# Patient Record
Sex: Male | Born: 1985 | Race: White | Hispanic: No | Marital: Single | Smoking: Never smoker
Health system: Southern US, Community
[De-identification: ages and names within clinical notes are randomized; demographics above are authoritative.]

## PROBLEM LIST (undated history)

## (undated) DIAGNOSIS — R569 Unspecified convulsions: Secondary | ICD-10-CM

## (undated) DIAGNOSIS — F79 Unspecified intellectual disabilities: Secondary | ICD-10-CM

## (undated) HISTORY — PX: HERNIA REPAIR: SHX51

---
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
## 2006-07-17 ENCOUNTER — Ambulatory Visit: Payer: Self-pay | Admitting: Pediatrics

## 2006-08-21 ENCOUNTER — Ambulatory Visit (HOSPITAL_COMMUNITY): Admission: RE | Admit: 2006-08-21 | Discharge: 2006-08-21 | Payer: Self-pay | Admitting: Pediatrics

## 2006-10-05 ENCOUNTER — Emergency Department (HOSPITAL_COMMUNITY): Admission: EM | Admit: 2006-10-05 | Discharge: 2006-10-05 | Payer: Self-pay | Admitting: Family Medicine

## 2006-12-29 ENCOUNTER — Ambulatory Visit (HOSPITAL_COMMUNITY): Admission: RE | Admit: 2006-12-29 | Discharge: 2006-12-29 | Payer: Self-pay | Admitting: *Deleted

## 2007-01-12 ENCOUNTER — Ambulatory Visit: Payer: Self-pay | Admitting: Pediatrics

## 2008-01-11 ENCOUNTER — Ambulatory Visit: Payer: Self-pay | Admitting: Pediatrics

## 2008-01-24 ENCOUNTER — Emergency Department (HOSPITAL_COMMUNITY): Admission: EM | Admit: 2008-01-24 | Discharge: 2008-01-24 | Payer: Self-pay | Admitting: Emergency Medicine

## 2008-05-22 ENCOUNTER — Emergency Department (HOSPITAL_COMMUNITY): Admission: EM | Admit: 2008-05-22 | Discharge: 2008-05-22 | Payer: Self-pay | Admitting: Emergency Medicine

## 2008-06-05 ENCOUNTER — Observation Stay (HOSPITAL_COMMUNITY): Admission: AD | Admit: 2008-06-05 | Discharge: 2008-06-07 | Payer: Self-pay | Admitting: Internal Medicine

## 2008-11-08 ENCOUNTER — Ambulatory Visit: Payer: Self-pay | Admitting: Pediatrics

## 2009-04-21 ENCOUNTER — Ambulatory Visit: Payer: Self-pay | Admitting: Pediatrics

## 2009-05-11 ENCOUNTER — Ambulatory Visit: Payer: Self-pay | Admitting: Pediatrics

## 2009-08-08 ENCOUNTER — Ambulatory Visit: Payer: Self-pay | Admitting: Pediatrics

## 2009-12-13 ENCOUNTER — Ambulatory Visit: Payer: Self-pay | Admitting: Pediatrics

## 2010-04-09 ENCOUNTER — Ambulatory Visit (HOSPITAL_COMMUNITY): Payer: Self-pay | Admitting: Psychiatry

## 2010-04-18 ENCOUNTER — Ambulatory Visit (HOSPITAL_COMMUNITY): Payer: Self-pay | Admitting: Psychiatry

## 2010-05-02 ENCOUNTER — Ambulatory Visit (HOSPITAL_COMMUNITY): Payer: Self-pay | Admitting: Psychiatry

## 2010-05-23 ENCOUNTER — Ambulatory Visit (HOSPITAL_COMMUNITY): Payer: Self-pay | Admitting: Psychiatry

## 2010-06-06 ENCOUNTER — Ambulatory Visit (HOSPITAL_COMMUNITY): Payer: Self-pay | Admitting: Psychiatry

## 2010-06-27 ENCOUNTER — Ambulatory Visit (HOSPITAL_COMMUNITY): Payer: Self-pay | Admitting: Psychiatry

## 2010-08-15 ENCOUNTER — Ambulatory Visit (HOSPITAL_COMMUNITY): Payer: Self-pay | Admitting: Psychiatry

## 2010-10-03 ENCOUNTER — Ambulatory Visit (HOSPITAL_COMMUNITY): Payer: Self-pay | Admitting: Psychiatry

## 2010-11-14 ENCOUNTER — Ambulatory Visit (HOSPITAL_COMMUNITY): Admit: 2010-11-14 | Payer: Self-pay | Admitting: Psychiatry

## 2010-11-14 ENCOUNTER — Encounter (HOSPITAL_COMMUNITY): Payer: Self-pay | Admitting: Physician Assistant

## 2010-11-14 DIAGNOSIS — F39 Unspecified mood [affective] disorder: Secondary | ICD-10-CM

## 2010-12-26 ENCOUNTER — Encounter (HOSPITAL_COMMUNITY): Payer: 59 | Admitting: Physician Assistant

## 2010-12-26 DIAGNOSIS — F39 Unspecified mood [affective] disorder: Secondary | ICD-10-CM

## 2011-02-26 NOTE — Consult Note (Signed)
NAME:  Jay Krueger, Jay Krueger                 ACCOUNT NO.:  [id number]   MEDICAL RECORD NO.:  [id number]          PATIENT TYPE:  INP   LOCATION:  3040                         FACILITY:  [facility]   PHYSICIAN:  Levert Feinstein, MD          DATE OF BIRTH:  [birth year]   DATE OF CONSULTATION:  06/05/2008  DATE OF DISCHARGE:                                 CONSULTATION   CHIEF COMPLAINT:  Episodes of whole body tremor.   HISTORY OF PRESENT ILLNESS:  The patient is a 25 year old gentleman,  mentally retarded, and family is at the bedside for history, the history  is also from reviewing the previous admission note at [facility] at [place] on April 21, 2008.   In summary, this is a 25 year old male, born mentally retarded, the  patient's mother reported that he was 6 months premature, had difficult,  prolonged laboring. patient had a history of staring spells, when he was  31 months old, was under my partner Dr. Darl Householder care at that time, had  evaluation, including MRI, and EEG at that time, but was not sure, it  truly represented absent seizure or not per family, but later on, in his  teens, he did develop generalized convulsive activity, and family  described every time it has happened [place].  The last episode was about 4 years ago, again happened in the  beach, the patient had general tonic-clonic movement, urinary  incontinence, lasted about 5-10 minutes, followed by post event  confusion.  Over the years, he has tried different antiepileptic  medications, but father could not recall the name of the medication,  reported intolerable side effect, including eye blinking, or excessive  sedation.   He was apparently seen by my other partner, Dr. Nash Shearer in 2008, and  reported tried a different antiepileptic medication of unknown name,  again suffered side effect later stopped it.  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   Since April 2009, he has began to have different episodes, described as  bilateral upper extremity tremorish movement, intermixed with bilateral  hand, wrist and finger flexion, last a few seconds and then relax, but  multiple recurrent similar activity that can last up to hours.  In  addition, he also has frequent staring spells, can have couple of times  in a day, but that was at his baseline.  This admission was prompted by  frequent appearing's of bilateral upper extremity tremors, wrist  flexion, finger flexion, followed by extension, started on Friday,  lasted whole night on Saturday, the patient also became very sick,  nausea, vomiting, and cannot keep any food down.  Initially when it  first started in  April 2009, it  has happened about once a month, has  increased in frequency, last few episodes was associated with urinary  tract infection, improved by antibiotic treatment, but this particular  episode over the weekend was prolonged, the patient has vomiting  multiple times, that has prompted his ER visiting.   Family also reported that [facility] seems to be closer by to their home  town, so instead of bring the patient to [place] again, family has  brought him over here.  Now he is back to his baseline, which is  agitated, but happy majority of the time, nonverbal, and ambulating  without difficulty.   He was admitted to the [facility] in April 21, 2008, where he  underwent video EEG monitoring and there was one event described as his  right hand came up to his mouth, had some slight flexion, extension  movement.  He is still looking at his father during this time, and then  extended his right hand,  continued with his typical activity.  The whole  episode lasted about 1 minute.  EEG around at that time, there is  evidence of 3 Hz spike and waves activity, that is generalized, but  overall the EEG is similar to his baseline activity, not necessarily  correlates with the clinical event.   During that video EEG monitoring, he also had staring episode, he did  have 3 Hz spike and wave activity last about 3 seconds around to the  time of the event, but again this is very similar to his baseline EEG  activity, not necessarily related to his clinical event.   He was discharged home with Ativan sublingual p.r.n.  The patient's  family reported that they did try 3 times, but has not been helpful.  Family declined trial of antileptic medications now.   REVIEW OF SYSTEMS:  Nonobtainable.   PAST MEDICAL HISTORY:  Mental retardation and ADHD.   CURRENT MEDICATIONS:  Clonidine.   SURGICAL HISTORY:  Umbilical hernia repair.   FAMILY HISTORY:  Sister has myasthenia gravis.   SOCIAL HISTORY:  Denies smoke or drink.  Lives with his family.   PHYSICAL EXAMINATION:  VITAL SIGNS:  Temperature 97.0, blood pressure  170/84, heart rate of 87, and respiration of 20.  CARDIAC:  Regular rate and rhythm.  PULMONARY:  Clear to auscultation bilaterally.  NECK:  Supple.  No carotid bruits.  NEUROLOGICAL:  He is agitated, but quite happy, follow family commands,  nonverbal, but did make a noise.  He is pacing, ambulating on the floor  without difficulty, using his arms without any difficulty.  His face is  symmetric.  Eye movement was conjugate, pupil equal, round, and reactive  to light.  Deep tendon reflex was hypoactive and symmetric.  Plantar  responses were deferred.   ASSESSMENT AND PLAN:  Episodes, detailed above, in the setting of a 49-  year-old, mentally delayed, previous history of general tonic-clonic  seizure, documented abnormal EEG.   The episode could certainly represent a seizure,  however, with the  prolonged episode lasting  the whole night, it will have caused  significant damage to the brain and the patient himself, and in  addition, there was no loss of consciousness, the patient continued to  interact during the episode.  In that regard, it is less likely the  whole event represent a continued seizure activity.  The other  possibility would be a behavior problem.   I did have extensive discussion with the family, the best way to discern  it,  is to have video EEG monitoring again, we can do this at [facility], or they can go back to [facility] under Dr. Briscoe Deutscher  O'Donovan for repeat video EEG monitoring.   Less likely imaging study will yield any significant insight in his  case,  especially that he has gone back to his baseline level.   Family insists on having imaging study to rule out a structural lesion,  which is low on my differentiation list.   The family also refused to another trial of antiepileptic medications,  which I think is reasonable.   MRI of the brain with and without contrast already been ordered by  primary care team, the patient apparently would need sedation to go  through the MRI.      Levert Feinstein, MD  Electronically Signed     YY/MEDQ  D:  06/06/2008  T:  06/07/2008  Job:  [id number]

## 2011-02-26 NOTE — H&P (Signed)
NAME:  Jay Krueger, Jay                 ACCOUNT NO.:  [id number]   MEDICAL RECORD NO.:  [id number]          PATIENT TYPE:  INP   LOCATION:  3040                         FACILITY:  [facility]   PHYSICIAN:  Lonia Blood, M.D.DATE OF BIRTH:  12-19-1985   DATE OF ADMISSION:  06/05/2008  DATE OF DISCHARGE:                              HISTORY & PHYSICAL   PRIMARY CARE PHYSICIAN:  [facility].   CHIEF COMPLAINT:  Spasmodic episodes/questionable seizures/onset April  2009.   PRESENT ILLNESS:  [name] is a 25 year old gentleman with  questionable cerebral palsy.  His parents are with him and confirm that  he has been developmentally delayed since his birth.  A formal diagnosis  has not been made.  He resides with his parents and is usually a very  active though nonverbal individual.  In April of this year, he suffered  a single isolated episode of severe spasm/tightening of the lower  extremities.  With this, he loses the ability to walk and has fallen  over a number of times.  He is, however, redirectable during these  episodes.  He is nonverbal at baseline, but will turn his head and  respond and move his arms during these spells.  There have been  episodes, however, during which he lost control of his bladder function.  After the initial episode in April, the patient did not have another  episode for a number of months.  Since that time, however, these  episodes have been occurring with increasing frequency.  Initially, they  occurred once a month and then every few weeks, and over the last  weekend, they have occurred every single day without fail.  The episodes  last 2 to 3 minutes at most.  The family has recorded one of these  episodes on DEVELOPED, but due to the limitations of the computers in  the dictating area, I am not able to download the software that is  required to view the DVD.  The patient has been evaluated by the  neurology department at [facility]  over the last 2 months and no formal  diagnosis has been made at this point.  With acceleration of the  episodes to the point of occurring every day, the patient's mother and  father presented with him to the [facility] Emergency Room for  evaluation.  He was subsequently transferred to [facility] for inpatient  admission.   The patient is evaluated in bed of 3040 at [facility] after transfer on  direct admission.  He is awake and alert.  He is nonverbal.  He appears  quite happy and is interactive.  His family reports that he is at his  present baseline.  He is walking currently and is up around the room,  and has be redirected multiple times.   PAST MEDICAL HISTORY:  1. Mental retardation/questionable cerebral palsy.  2. Known arachnoid cyst in the anterior aspect of the mid cranial      fossa on the left impacting the temporal lobe via MRI March 2008      and confirmed  on CT scan April 2009.   OUTPATIENT MEDICATIONS.:  1. Clonidine 0.1 mg p.o. b.i.d. for behavioral issues.  2. Zofran 8 mg p.o. p.r.n.  3. The patient recently completed a course of Septra p.o. b.i.d.   ALLERGIES:  No known drug allergies.   FAMILY HISTORY:  Noncontributory.   SOCIAL HISTORY:  The patient does not smoke nor does he drink.  He lives  locally with his parents.   DATA REVIEW:  Hemoglobin is elevated at 17.3 with an MCV of 83.  White  count and platelet count are normal.  ABG is unremarkable.  Lactic acid  level is normal.  BMET is unremarkable.  Magnesium and phosphorus are  normal.  CT scan of the head from April 2009 reveals is limited due to  motion artifact but reveals no acute disease.  MRI in March of 2008  reveals the above-mentioned arachnoid cyst but is otherwise unrevealing.   PHYSICAL EXAM:  VITALS: Temperature 99, blood pressure 133/85, heart  rate 98, respiratory 20, O2 sats 96% room air.  GENERAL:  Well-developed, well-nourished mentally retarded gentleman in  no acute respiratory  distress.  HEENT:  Normocephalic, atraumatic.  Pupils equal round react to light  accommodation.  Extraocular muscles intact bilaterally.  OC essentially  clear.  NECK:  No JVD.  LUNGS:  Clear to auscultation bilaterally without wheezes or rhonchi.  CARDIOVASCULAR:  Regular rate and rhythm without murmur, gallop or rub.  Normal S1, S2.  ABDOMEN:  Nontender, nondistended, soft bowel sounds present.  No  hepatosplenomegaly, rebound or ascites.  EXTREMITIES:  There is no significant cyanosis, clubbing, edema  bilateral lower extremities.  NEUROLOGIC:  Exam is very difficult due to the patient's inability to  comply.  Cranial nerves II-XII are intact bilaterally, however and  pupils are equal and reactive and symmetric.  The patient does not  display any focal weakness and is ambulating about the room and is  actually quite strong.  There are no obvious cranial nerve deficits.  The patient is nonverbal but does communicate using hand gestures.  At  the present time he is extremely hungry and asks for something to drink  and eat.   IMPRESSION AND PLAN:  1. Sign-symptom complex - Many of the elements of these episodes sound      concerning for a seizure.  Nonetheless, the patient is alert and      interactive during these episodes, and this essentially rules out      the possibility of a seizure disorder.  Apparently, the patient has      had an extensive workup at [facility].      Not wishing to duplicate their efforts, I will hold on extensive      testing until we can obtain those records.  I am told this      evaluation included multiple EEGs.  The patient does have a family      member who has myasthenia gravis.  Though it is a long shot,      certainly the possibility of lower extremity weakness in the      patient is not able to otherwise communicate could result in some      of the symptoms he is experiencing.  I will, therefore, send off an       acetylcholine and antibody assay.  I will also check a prolactin      level to further support the idea that these are not seizures.  B12      and folic acid levels will be checked.  Consideration will be given      to sedated MRI for reevaluation of the patient's known arachnoid      cyst to rule out other intracranial abnormalities if records from      [facility] prove that this has been done.  2. Cerebral palsy/developmental delay - we will continue the patient's      clonidine.  We will follow the patient for behavioral issues during      his hospital stay.  Consideration could be given to psychiatric      evaluation if no physiologic basis is felt to be identifiable for      the above-listed sign-symptom complex.      Lonia Blood, M.D.  Electronically Signed     JTM/MEDQ  D:  06/05/2008  T:  06/05/2008  Job:  [id number]   cc:   [facility]

## 2011-03-01 NOTE — Procedures (Signed)
EEG NUMBER:  [id number].   HISTORY:  A 25 year old young man with cognitive delays with episodes of  staring, unresponsive episodes lasting 20-30 seconds.  Study is being done  to look for the presence of seizures 780.22.   PROCEDURE:  The tracing is carried out on a 32-channel digital Cadwell  recorder reformatted into 16 channel montages with one devoted to EKG.  The  patient was awake during the recording and active.  He takes clonidine.   DESCRIPTION OF FINDINGS:  Dominant frequency is a rhythmic 20 microvolt 6-7  Hz activity that is prominent broadly.  Superimposed upon this is mixed-  frequency with somewhat higher-voltage dysrhythmic theta and lower and upper  delta range activity.  The patient is awake throughout the record and does  not change state of arousal other than there are some times that he appears  to be somewhat more calm.  There was no focal slowing or interictal  epileptiform activity in the form of spikes or sharp waves.   IMPRESSION:  Abnormal EEG on the basis of mild diffuse background slowing.  This is a nonspecific indicator of neuronal dysfunction that may be either  on a primary degenerative basis or secondary to a variety of toxic or  metabolic etiologies.      Deanna Artis. Sharene Skeans, M.D.  Electronically Signed     NFA:OZHY  D:  08/22/2006 01:02:06  T:  08/22/2006 11:43:12  Job #:  [id number]   cc:   Candace Gallus. Ferd Glassing, M.D.  Fax: [phone]

## 2011-03-27 ENCOUNTER — Encounter (HOSPITAL_COMMUNITY): Payer: 59 | Admitting: Physician Assistant

## 2011-03-27 DIAGNOSIS — F39 Unspecified mood [affective] disorder: Secondary | ICD-10-CM

## 2011-03-27 DIAGNOSIS — F848 Other pervasive developmental disorders: Secondary | ICD-10-CM

## 2011-07-09 LAB — CBC
Hemoglobin: 16.1
MCHC: 34.3
RBC: 5.53
WBC: 9.3

## 2011-07-09 LAB — DIFFERENTIAL
Basophils Relative: 1
Lymphocytes Relative: 20
Monocytes Absolute: 0.5
Monocytes Relative: 5
Neutro Abs: 6.8
Neutrophils Relative %: 74

## 2011-07-09 LAB — POCT I-STAT, CHEM 8
BUN: 10
Calcium, Ion: 1.13
Chloride: 105
Glucose, Bld: 105 — ABNORMAL HIGH
HCT: 49
Potassium: 3.8

## 2011-07-12 LAB — CBC
HCT: 52.7 — ABNORMAL HIGH
MCHC: 33.8
MCV: 86.4
RBC: 6.1 — ABNORMAL HIGH
WBC: 10.7 — ABNORMAL HIGH

## 2011-07-12 LAB — URINALYSIS, ROUTINE W REFLEX MICROSCOPIC
Bilirubin Urine: NEGATIVE
Glucose, UA: NEGATIVE
Leukocytes, UA: NEGATIVE
Nitrite: NEGATIVE
Specific Gravity, Urine: 1.024
pH: 7

## 2011-07-12 LAB — DIFFERENTIAL
Basophils Absolute: 0
Eosinophils Relative: 0
Lymphocytes Relative: 20
Neutro Abs: 7.9 — ABNORMAL HIGH
Neutrophils Relative %: 74

## 2011-07-12 LAB — COMPREHENSIVE METABOLIC PANEL
BUN: 8
CO2: 27
Chloride: 105
Creatinine, Ser: 0.83
GFR calc non Af Amer: >60
Glucose, Bld: 111 — ABNORMAL HIGH
Total Bilirubin: 0.9

## 2011-07-12 LAB — URINE MICROSCOPIC-ADD ON

## 2011-07-31 ENCOUNTER — Encounter (HOSPITAL_COMMUNITY): Payer: 59 | Admitting: Physician Assistant

## 2011-07-31 DIAGNOSIS — F39 Unspecified mood [affective] disorder: Secondary | ICD-10-CM

## 2011-07-31 DIAGNOSIS — F848 Other pervasive developmental disorders: Secondary | ICD-10-CM

## 2012-01-29 ENCOUNTER — Ambulatory Visit (INDEPENDENT_AMBULATORY_CARE_PROVIDER_SITE_OTHER): Payer: 59 | Admitting: Physician Assistant

## 2012-01-29 DIAGNOSIS — F39 Unspecified mood [affective] disorder: Secondary | ICD-10-CM

## 2012-01-29 MED ORDER — CLONIDINE HCL 0.1 MG PO TABS: 0.1000 mg | ORAL_TABLET | Freq: Every day | ORAL | Status: DC

## 2012-01-29 MED ORDER — DIVALPROEX SODIUM ER 250 MG PO TB24: 250.0000 mg | ORAL_TABLET | Freq: Every day | ORAL | Status: DC

## 2012-01-29 MED ORDER — DIVALPROEX SODIUM ER 250 MG PO TB24: 250.0000 mg | ORAL_TABLET | Freq: Two times a day (BID) | ORAL | Status: DC

## 2012-02-03 NOTE — Progress Notes (Signed)
   [facility] Follow-up Outpatient Visit  Jay Krueger 1985-11-13  Date: 01/28/2021   Subjective: Jay presents today with his father and caretaker to follow up on medications prescribed for his mood disorder. Father reports that he is doing well for the most part. In [place] is occasional "issues." He states that at times Jay can be very stubborn and has an occasional angry outburst. He also reports that in the month of March he had 3 "episodes." These episodes are periods of seizure-like activity which have gone undiagnosed. He reports that Jay sleeps well from about 9 or 10 at night until 7 AM. His appetite is good and he is maintaining his weight.  There were no vitals filed for this visit.  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 He will followup in 4-6 months.  Dean Goldner, PA-C

## 2012-08-03 ENCOUNTER — Ambulatory Visit (INDEPENDENT_AMBULATORY_CARE_PROVIDER_SITE_OTHER): Payer: 59 | Admitting: Physician Assistant

## 2012-08-03 DIAGNOSIS — F848 Other pervasive developmental disorders: Secondary | ICD-10-CM

## 2012-08-03 DIAGNOSIS — F39 Unspecified mood [affective] disorder: Secondary | ICD-10-CM | POA: Insufficient documentation

## 2012-08-03 MED ORDER — CLONIDINE HCL 0.1 MG PO TABS: 0.1000 mg | ORAL_TABLET | Freq: Every day | ORAL | Status: DC

## 2012-08-03 MED ORDER — DIVALPROEX SODIUM ER 250 MG PO TB24: 250.0000 mg | ORAL_TABLET | Freq: Two times a day (BID) | ORAL | Status: DC

## 2012-08-03 NOTE — Progress Notes (Signed)
   [facility] Follow-up Outpatient Visit  [place] Berg[place] [birth year]  Date: 08/03/2012  Subjective: [place] presents today with his father and caretaker to followup on his treatment for his mood disorder and pervasive developmental delay. Father reports that, all in all, [place] has been stable. He continues to have an occasional temper tantrum, and "episode." He reports that the episodes are milder, but they last longer, sometimes up to 4 hours. After the episode he is active and cheerful. Sleep and appetite are good.  [place] has been to multiple various neurologists including some local, at [facility], and at [place]. None of these visits have provided any insight into Anton's disorder.  There were no vitals filed for this visit.  Mental Status Examination  Appearance: Casual Alert: Yes Attention: good  Cooperative: Yes Eye Contact: Good Speech: Unintelligible Psychomotor Activity: Restlessness Memory/Concentration: Undetermined Oriented: Undetermined Mood: Euthymic Affect: Congruent Thought Processes and Associations: Undetermined Fund of Knowledge: Poor Thought Content: Undetermined Insight: Poor Judgement: Fair  Diagnosis: Mood disorder NOS, pervasive developmental delay  Treatment Plan: We will continue his Depakote ER 250 mg twice daily, and clonidine 0.1 mg at bedtime. He will return for followup in 6 months. [place] may benefit from a school for children with developmental delay, if the family is interested.  Redell Bhandari, PA-C

## 2012-09-02 ENCOUNTER — Other Ambulatory Visit (HOSPITAL_COMMUNITY): Payer: Self-pay | Admitting: Physician Assistant

## 2012-09-02 MED ORDER — CLONIDINE HCL 0.1 MG PO TABS: 0.1000 mg | ORAL_TABLET | Freq: Every day | ORAL | Status: DC

## 2012-09-02 MED ORDER — DIVALPROEX SODIUM ER 250 MG PO TB24: 250.0000 mg | ORAL_TABLET | Freq: Two times a day (BID) | ORAL | Status: DC

## 2012-11-02 ENCOUNTER — Telehealth (HOSPITAL_COMMUNITY): Payer: Self-pay

## 2012-11-03 ENCOUNTER — Other Ambulatory Visit (HOSPITAL_COMMUNITY): Payer: Self-pay | Admitting: Physician Assistant

## 2013-02-01 ENCOUNTER — Ambulatory Visit (INDEPENDENT_AMBULATORY_CARE_PROVIDER_SITE_OTHER): Payer: 59 | Admitting: Physician Assistant

## 2013-02-01 ENCOUNTER — Encounter (HOSPITAL_COMMUNITY): Payer: Self-pay

## 2013-02-01 DIAGNOSIS — F848 Other pervasive developmental disorders: Secondary | ICD-10-CM

## 2013-02-01 DIAGNOSIS — F39 Unspecified mood [affective] disorder: Secondary | ICD-10-CM

## 2013-02-01 NOTE — Progress Notes (Signed)
   [facility] Follow-up Outpatient Visit  Jay Krueger August 22, 1986  Date: 02/01/2013   Subjective: Jay presents today with his father and 51, [name], to followup on his treatment for his mood disorder and developmental delay. They report that things are "about the same." It has been about 3 weeks since he had his last "episode." He was previously having them weekly. They report that they now last 5-6 hours. He is sleeping well and his appetite is good. There've been no behavioral problems. Father has not gotten him over to the epilepsy center for evaluation. He is sleeping well, and his appetite is good.  There were no vitals filed for this visit.  Mental Status Examination  Appearance: Casual Alert: Yes Attention: fair  Cooperative: Yes Eye Contact: Fair Speech: No speech, only laughter Psychomotor Activity: Increased Memory/Concentration: Undetermined Oriented: Undetermined Mood: Euthymic Affect: Appropriate Thought Processes and Associations: Undetermined Fund of Knowledge: Poor Thought Content: Undetermined Insight: Poor Judgement: Fair  Diagnosis: Pervasive developmental delay, mood disorder NOS  Treatment Plan: We will continue the Depakote ER 250 mg twice daily, and clonidine 0.1 mg at bedtime. He will return for followup in 6 months. They are encouraged to call if concerns arise.   Oseias Horsey, PA-C

## 2013-08-09 ENCOUNTER — Ambulatory Visit (HOSPITAL_COMMUNITY): Payer: Self-pay | Admitting: Physician Assistant

## 2013-08-16 ENCOUNTER — Ambulatory Visit (HOSPITAL_COMMUNITY): Payer: Self-pay | Admitting: Physician Assistant

## 2013-08-30 ENCOUNTER — Other Ambulatory Visit (HOSPITAL_COMMUNITY): Payer: Self-pay | Admitting: Physician Assistant

## 2013-09-01 NOTE — Telephone Encounter (Signed)
Per Dr. Donell Beers, do not refill at this time. Too long since last refill. Will review medications with patient at appt 09/15/13, and prescribe as appropriate

## 2013-09-02 ENCOUNTER — Other Ambulatory Visit (HOSPITAL_COMMUNITY): Payer: Self-pay | Admitting: Physician Assistant

## 2013-09-02 DIAGNOSIS — F39 Unspecified mood [affective] disorder: Secondary | ICD-10-CM

## 2013-09-02 NOTE — Telephone Encounter (Signed)
Refill request for Depakote and clonidine. Chart reviewed, spoke with father Jay Krueger who states patients appointments had been changed due to [employer] leaving. Pt does have appointment 09/15/13 at 1300 with Dr Donell Beers. Refills given for 30 day supply to last until next appointment. Spoke with pharmacist also, explaining reason for 30 day supply with no refills. Father notified that refills complete.

## 2013-09-15 ENCOUNTER — Ambulatory Visit (INDEPENDENT_AMBULATORY_CARE_PROVIDER_SITE_OTHER): Payer: 59 | Admitting: Psychiatry

## 2013-09-15 VITALS — BP 144/93 | HR 117 | Ht 68.0 in | Wt 240.0 lb

## 2013-09-15 DIAGNOSIS — F39 Unspecified mood [affective] disorder: Secondary | ICD-10-CM

## 2013-09-15 DIAGNOSIS — F919 Conduct disorder, unspecified: Secondary | ICD-10-CM

## 2013-09-15 MED ORDER — DIVALPROEX SODIUM ER 250 MG PO TB24: ORAL_TABLET | ORAL | Status: DC

## 2013-09-15 MED ORDER — CLONIDINE HCL 0.1 MG PO TABS: 0.1000 mg | ORAL_TABLET | Freq: Every day | ORAL | Status: DC

## 2013-09-15 NOTE — Progress Notes (Signed)
[facility] MD Progress Note  09/15/2013 1:41 PM Jay Krueger  MRN:  [id number] Subjective:  Happy Today this patient was seen with his father Elijah Birk and his healthcare provider Rosey Bath. The patient is doing fairly well. He does seem to have episodes that last for hours at least once a month. At that time he seems to be paralyzed he is in extreme discomfort it appears to stress and he yells. Other than these isolated events the patient is calm pleasant and cooperative. The patient is a severe cognitive disorder. He is unable to speak with me. His cognitive deficit began of birth. He was described as a floppy baby. According to the father he and his healthcare provider this patient does not demonstrate any consistent signs being depressed. He does not cry. He is sleeping and eating well. He demonstrates no evidence of hallucinations. He does not get angry very often. He is not very irritable. Apparently he was airborne somewhat impulsive before starting on Depakote. His father and his healthcare provider claimed that Depakote has been helpful he takes clonidine 0.1 mg at night which also helps him sleep. The patient is seen multiple neurologists. They're unclear what those episodes are all about. General the patient is quite stable. The patient does not drink any alcohol use any drugs. The patient spends all day with his care providers. He does simple tasks with this provider. Overall the patient seems to be doing fairly well. Diagnosis:   DSM5: Schizophrenia Disorders:   Obsessive-Compulsive Disorders:   Trauma-Stressor Disorders:   Substance/Addictive Disorders:   Depressive Disorders:    Axis I: Conduct Disorder  ADL's:  Impaired  Sleep: Good  Appetite:  Good  Suicidal Ideation:  no Homicidal Ideation:  no AEB (as evidenced by):  Psychiatric Specialty Exam: ROS  There were no vitals taken for this visit.There is no height or weight on file to calculate BMI.  General Appearance: Casual  Eye  Contact::  Minimal  Speech:  Garbled  Volume:  Normal  Mood:  Euthymic  Affect:  NA  Thought Process:  Irrelevant  Orientation:  NA  Thought Content:  NA  Suicidal Thoughts:  No  Homicidal Thoughts:  No  Memory:  no  Judgement:  Impaired  Insight:  Lacking  Psychomotor Activity:  Increased  Concentration:  Poor  Recall:  Poor  Akathisia:  No  Handed:  Right  AIMS (if indicated):     Assets:  Desire for Improvement  Sleep:      Current Medications: Current Outpatient Prescriptions  Medication Sig Dispense Refill  . cloNIDine (CATAPRES) 0.1 MG tablet Take 1 tablet (0.1 mg total) by mouth at bedtime.  90 tablet  1  . cloNIDine (CATAPRES) 0.1 MG tablet Take 1 tablet (0.1 mg total) by mouth at bedtime.  90 tablet  1  . divalproex (DEPAKOTE ER) 250 MG 24 hr tablet Take 1 tablet (250 mg total) by mouth 2 (two) times daily.  180 tablet  1  . divalproex (DEPAKOTE ER) 250 MG 24 hr tablet TAKE 1 TABLET (250 MG TOTAL) BY MOUTH 2 (TWO) TIMES DAILY.  60 tablet  1   No current facility-administered medications for this visit.    Lab Results: No results found for this or any previous visit (from the past 48 hour(s)).  Physical Findings: AIMS:  , ,  ,  ,    CIWA:    COWS:     Treatment Plan Summary: At this time the patient will continue taking Depakote 250 mg  twice a day. The patient will continue Klonopin 0.1 mg. The patient return to see me for more thorough valuation 3 months. At that time we'll confirm a Depakote level and other blood work. This patient is not dangerous to himself or others. He seems to be getting good care.  Plan:  Medical Decision Making Problem Points:  Established problem, stable/improving (1) Data Points:  Review of medication regiment & side effects (2)  I certify that inpatient services furnished can reasonably be expected to improve the patient's condition.   Anasofia Micallef IRVING 09/15/2013, 1:41 PM

## 2013-11-27 ENCOUNTER — Other Ambulatory Visit (HOSPITAL_COMMUNITY): Payer: Self-pay | Admitting: Psychiatry

## 2013-11-27 DIAGNOSIS — F39 Unspecified mood [affective] disorder: Secondary | ICD-10-CM

## 2013-12-15 ENCOUNTER — Ambulatory Visit (INDEPENDENT_AMBULATORY_CARE_PROVIDER_SITE_OTHER): Payer: 59 | Admitting: Psychiatry

## 2013-12-15 VITALS — BP 126/86 | HR 100 | Ht 68.0 in | Wt 235.0 lb

## 2013-12-15 DIAGNOSIS — F840 Autistic disorder: Secondary | ICD-10-CM

## 2013-12-15 DIAGNOSIS — F809 Developmental disorder of speech and language, unspecified: Secondary | ICD-10-CM

## 2013-12-15 DIAGNOSIS — F39 Unspecified mood [affective] disorder: Secondary | ICD-10-CM

## 2013-12-15 MED ORDER — CLONIDINE HCL 0.1 MG PO TABS: 0.1000 mg | ORAL_TABLET | Freq: Every day | ORAL | Status: DC

## 2013-12-15 MED ORDER — DIVALPROEX SODIUM ER 250 MG PO TB24: ORAL_TABLET | ORAL | Status: DC

## 2013-12-15 NOTE — Progress Notes (Signed)
[facility] MD Progress Note  12/15/2013 2:51 PM Jay Krueger  MRN:  [id number] Subjective: smiling Today this patient is seen with this paraprofessional assistant Mordecai Maes and his father Elijah Birk. The patient is stable. There is no changes. The patient essentially is nonverbal. The patient demonstrates no evidence since I seen him of being agitated or violent. He is not crying. According to his caregivers she is sleeping and eating well. He continues to have episodic periods where he becomes very irritable and then a day or so later while he 0 double he has an episode where he becomes floppy and no longer control his body muscle tone. He then lies down the floor and has no organized activity. Eventually passes and the patient is back to himself. Generally though his character was described as calm and cooperative. He is compliant in content. The patient is smiling and much of the interview. At this time his father describes it is manageable. They are curious about what are these episodes about but apparently no neurologist is willing to make any interventions. Is generally been concluded that they're not seizures. These episodes are not characterized by bowel or bladder incontinence. I not clear if they're occurring more frequently or less frequently. At this time this patient seems to be stable and is being maintained at home with his family. The patient lives with his father and mother. The patient takes clonidine 1 mg and Depakote 250 twice a day. He is compliant with his medicine. They've been multiple attempts to get blood work in this patient but he has been very aggressive and resistant to any needle sticks. Diagnosis:   DSM5: Schizophrenia Disorders:   Obsessive-Compulsive Disorders:   Trauma-Stressor Disorders:   Substance/Addictive Disorders:   Depressive Disorders:   Total Time spent with patient:   Axis I: Autistic Disorder  ADL's:  Intact  Sleep: Good  Appetite:  Good  Suicidal Ideation:   no Homicidal Ideation:  none AEB (as evidenced by):  Psychiatric Specialty Exam: Physical Exam  ROS  Blood pressure 126/86, pulse 100, height 5\' 8"  (1.727 m), weight 235 lb (106.595 kg).Body mass index is 35.74 kg/(m^2).  General Appearance: Disheveled  Eye Contact::  Poor  Speech:  Blocked  Volume:  Decreased  Mood:  NA  Affect:  Blunt  Thought Process:  Disorganized  Orientation:  NA  Thought Content:  WDL  Suicidal Thoughts:  No  Homicidal Thoughts:  No  Memory:  NA  Judgement:  Poor  Insight:  Lacking  Psychomotor Activity:  Increased  Concentration:  Poor  Recall:  Poor  Fund of Knowledge:Poor  Language: Poor  Akathisia:  No  Handed:  Right  AIMS (if indicated):     Assets:  Desire for Improvement  Sleep:      Musculoskeletal: Strength & Muscle Tone: flaccid Gait & Station: ataxic Patient leans: N/A  Current Medications: Current Outpatient Prescriptions  Medication Sig Dispense Refill  . cloNIDine (CATAPRES) 0.1 MG tablet Take 1 tablet (0.1 mg total) by mouth at bedtime.  90 tablet  2  . cloNIDine (CATAPRES) 0.1 MG tablet Take 1 tablet (0.1 mg total) by mouth at bedtime.  90 tablet  3  . divalproex (DEPAKOTE ER) 250 MG 24 hr tablet Take 1 tablet (250 mg total) by mouth 2 (two) times daily.  180 tablet  1  . divalproex (DEPAKOTE ER) 250 MG 24 hr tablet TAKE 1 TABLET (250 MG TOTAL) BY MOUTH 2 (TWO) TIMES DAILY.  60 tablet  10   No  current facility-administered medications for this visit.    Lab Results: No results found for this or any previous visit (from the past 48 hour(s)).  Physical Findings: AIMS:  , ,  ,  ,    CIWA:    COWS:     Treatment Plan Summary: Medication management  Plan: At this time the patient will continue taking clonidine 0.1 mg every night and will continue Depakote 250 twice a day. At this time will not attempt to get blood work. The patient appears healthy and seems to be getting good care. He shows no bruising and appears  healthy. His affect is carefree and primitive. This patient will be seen again in 4 months. Medical Decision Making Problem Points:  Established problem, worsening (2) Data Points:  Review of medication regiment & side effects (2)  I certify that inpatient services furnished can reasonably be expected to improve the patient's condition.   Verdella Laidlaw IRVING 12/15/2013, 2:51 PM

## 2014-04-27 ENCOUNTER — Ambulatory Visit (HOSPITAL_COMMUNITY): Payer: Self-pay | Admitting: Psychiatry

## 2014-05-11 ENCOUNTER — Ambulatory Visit (HOSPITAL_COMMUNITY): Payer: Self-pay | Admitting: Psychiatry

## 2014-05-11 ENCOUNTER — Ambulatory Visit (INDEPENDENT_AMBULATORY_CARE_PROVIDER_SITE_OTHER): Payer: 59 | Admitting: Psychiatry

## 2014-05-11 DIAGNOSIS — F840 Autistic disorder: Secondary | ICD-10-CM

## 2014-05-11 DIAGNOSIS — F39 Unspecified mood [affective] disorder: Secondary | ICD-10-CM

## 2014-05-11 MED ORDER — CLONIDINE HCL 0.1 MG PO TABS
0.1000 mg | ORAL_TABLET | Freq: Every day | ORAL | Status: AC
Start: 2014-05-11 — End: ?

## 2014-05-11 MED ORDER — DIVALPROEX SODIUM ER 250 MG PO TB24: ORAL_TABLET | ORAL | Status: AC

## 2014-05-11 NOTE — Progress Notes (Signed)
[facility]  05/11/2014 4:14 PM Jay Krueger  MRN:  [id number] Subjective:  Doing well Today the patient is seen with his father Jay Krueger who is their assistance. The patient seems to be doing well. He does not use language very much but he seems to make his needs known. He's having no physical complaints at all. He has rare isolated agitation but never violent. His father seems to take excellent care of him. The patient is eating well and sleeping well. He does not appear to be responding to voices or visions. The patient is not irritable. He sill a friendly and quite primitive. The patient is actually doing very well. Both his father and person taking care of him say that he has not changed at all. He takes his medicine as prescribed. Diagnosis:   DSM5: Schizophrenia Disorders:   Obsessive-Compulsive Disorders:   Trauma-Stressor Disorders:   Substance/Addictive Disorders:   Depressive Disorders:   Total Time spent with patient:   Axis I: Autistic Disorder  ADL's:  Intact  Sleep: Good  Appetite:  Good  Suicidal Ideation:  no Homicidal Ideation:  none AEB (as evidenced by):  Psychiatric Specialty Exam: Physical Exam  ROS  There were no vitals taken for this visit.There is no weight on file to calculate BMI.  General Appearance:   Eye Contact::  Fair  Speech:  Slurred  Volume:  Normal  Mood:  Euthymic  Affect:  Constricted  Thought Process:  Disorganized  Orientation:  NA  Thought Content:  NA  Suicidal Thoughts:  No  Homicidal Thoughts:  No  Memory:  NA  Judgement:  NA  Insight:  Fair  Psychomotor Activity:  Normal  Concentration:  Poor  Recall:  Poor  Fund of Knowledge:Poor  Language: Poor  Akathisia:  No  Handed:  Right  AIMS (if indicated):     Assets:  Communication Skills  Sleep:      Musculoskeletal: Strength & Muscle Tone: flaccid Gait & Station: unsteady Patient leans: Right  Current Medications: Current Outpatient Prescriptions   Medication Sig Dispense Refill  . cloNIDine (CATAPRES) 0.1 MG tablet Take 1 tablet (0.1 mg total) by mouth at bedtime.  90 tablet  2  . cloNIDine (CATAPRES) 0.1 MG tablet Take 1 tablet (0.1 mg total) by mouth at bedtime.  90 tablet  3  . divalproex (DEPAKOTE ER) 250 MG 24 hr tablet Take 1 tablet (250 mg total) by mouth 2 (two) times daily.  180 tablet  1  . divalproex (DEPAKOTE ER) 250 MG 24 hr tablet TAKE 1 TABLET (250 MG TOTAL) BY MOUTH 2 (TWO) TIMES DAILY.  60 tablet  10   No current facility-administered medications for this visit.    Lab Results: No results found for this or any previous visit (from the past 48 hour(s)).  Physical Findings: AIMS:  , ,  ,  ,    CIWA:    COWS:     Treatment Plan Summary: At this time this patient continue taking Depakote 250 twice a day. Also continue taking clonidine 1 mg a day. The patient will continue their care as father and his caregiver Jay Krueger. The patient is doing quite well. He showed no signs of Depakote toxicity and he is not violent or aggressive. Is not dangerous to himself nor to anyone else.  Plan:  Medical Decision Making Problem Points:  Established problem, stable/improving (1) Data Points:  Review of medication regiment & side effects (2)  I certify that inpatient services furnished can  reasonably be expected to improve the patient's condition.   Lindberg Zenon IRVING 05/11/2014, 4:14 PM

## 2014-07-12 ENCOUNTER — Emergency Department (HOSPITAL_BASED_OUTPATIENT_CLINIC_OR_DEPARTMENT_OTHER): Payer: 59

## 2014-07-12 ENCOUNTER — Emergency Department (HOSPITAL_BASED_OUTPATIENT_CLINIC_OR_DEPARTMENT_OTHER)
Admission: EM | Admit: 2014-07-12 | Discharge: 2014-07-12 | Disposition: A | Discharge status: Home / Self Care | Payer: 59 | Attending: Emergency Medicine | Admitting: Emergency Medicine

## 2014-07-12 ENCOUNTER — Encounter (HOSPITAL_BASED_OUTPATIENT_CLINIC_OR_DEPARTMENT_OTHER): Payer: Self-pay | Admitting: Emergency Medicine

## 2014-07-12 DIAGNOSIS — R000 Tachycardia, unspecified: Secondary | ICD-10-CM | POA: Diagnosis not present

## 2014-07-12 DIAGNOSIS — R259 Unspecified abnormal involuntary movements: Secondary | ICD-10-CM | POA: Insufficient documentation

## 2014-07-12 DIAGNOSIS — K5900 Constipation, unspecified: Secondary | ICD-10-CM | POA: Diagnosis not present

## 2014-07-12 DIAGNOSIS — Z79899 Other long term (current) drug therapy: Secondary | ICD-10-CM | POA: Insufficient documentation

## 2014-07-12 DIAGNOSIS — R251 Tremor, unspecified: Secondary | ICD-10-CM

## 2014-07-12 LAB — COMPREHENSIVE METABOLIC PANEL
ALBUMIN: 4.2 g/dL (ref 3.5–5.2)
ALT: 20 U/L (ref 0–53)
AST: 21 U/L (ref 0–37)
Alkaline Phosphatase: 72 U/L (ref 39–117)
Anion gap: 13 (ref 5–15)
BUN: 13 mg/dL (ref 6–23)
CALCIUM: 9.6 mg/dL (ref 8.4–10.5)
CHLORIDE: 103 meq/L (ref 96–112)
CO2: 26 mEq/L (ref 19–32)
CREATININE: 0.7 mg/dL (ref 0.50–1.35)
GFR calc Af Amer: >90 mL/min (ref >90)
GFR calc non Af Amer: >90 mL/min (ref >90)
Glucose, Bld: 101 mg/dL — ABNORMAL HIGH (ref 70–99)
Potassium: 4 mEq/L (ref 3.7–5.3)
Sodium: 142 mEq/L (ref 137–147)
Total Bilirubin: 0.4 mg/dL (ref 0.3–1.2)
Total Protein: 8.1 g/dL (ref 6.0–8.3)

## 2014-07-12 LAB — URINALYSIS, ROUTINE W REFLEX MICROSCOPIC
Bilirubin Urine: NEGATIVE
Glucose, UA: NEGATIVE mg/dL
Hgb urine dipstick: NEGATIVE
KETONES UR: NEGATIVE mg/dL
Leukocytes, UA: NEGATIVE
Nitrite: NEGATIVE
PROTEIN: NEGATIVE mg/dL
Specific Gravity, Urine: 1.017 (ref 1.005–1.030)
Urobilinogen, UA: 0.2 mg/dL (ref 0.0–1.0)
pH: 7.5 (ref 5.0–8.0)

## 2014-07-12 LAB — CBC WITH DIFFERENTIAL/PLATELET
BASOS PCT: 0 % (ref 0–1)
Basophils Absolute: 0 10*3/uL (ref 0.0–0.1)
EOS PCT: 2 % (ref 0–5)
Eosinophils Absolute: 0.1 10*3/uL (ref 0.0–0.7)
HEMATOCRIT: 48.8 % (ref 39.0–52.0)
Hemoglobin: 17.1 g/dL — ABNORMAL HIGH (ref 13.0–17.0)
LYMPHS PCT: 33 % (ref 12–46)
Lymphs Abs: 2.9 10*3/uL (ref 0.7–4.0)
MCH: 29.2 pg (ref 26.0–34.0)
MCHC: 35 g/dL (ref 30.0–36.0)
MCV: 83.4 fL (ref 78.0–100.0)
MONO ABS: 0.7 10*3/uL (ref 0.1–1.0)
Monocytes Relative: 8 % (ref 3–12)
NEUTROS ABS: 5.1 10*3/uL (ref 1.7–7.7)
Neutrophils Relative %: 57 % (ref 43–77)
Platelets: 189 10*3/uL (ref 150–400)
RBC: 5.85 MIL/uL — ABNORMAL HIGH (ref 4.22–5.81)
RDW: 12.5 % (ref 11.5–15.5)
WBC: 8.9 10*3/uL (ref 4.0–10.5)

## 2014-07-12 LAB — VALPROIC ACID LEVEL: Valproic Acid Lvl: 24.6 ug/mL — ABNORMAL LOW (ref 50.0–100.0)

## 2014-07-12 MED ORDER — SODIUM CHLORIDE 0.9 % IV BOLUS (SEPSIS)
1000.0000 mL | Freq: Once | INTRAVENOUS | Status: AC
  Administered 2014-07-12: 1000 mL via INTRAVENOUS

## 2014-07-12 MED ORDER — LORAZEPAM 1 MG PO TABS: 1.0000 mg | ORAL_TABLET | Freq: Four times a day (QID) | ORAL | Status: DC | PRN

## 2014-07-12 MED ORDER — LORAZEPAM 2 MG/ML IJ SOLN
1.0000 mg | Freq: Once | INTRAMUSCULAR | Status: AC
  Administered 2014-07-12: 1 mg via INTRAVENOUS
  Filled 2014-07-12: qty 1

## 2014-07-12 NOTE — ED Provider Notes (Signed)
CSN: [id number]     Arrival date & time 07/12/14  1437 History   First MD Initiated Contact with Patient 07/12/14 1502     No chief complaint on file.    (Consider location/radiation/quality/duration/timing/severity/associated sxs/prior Treatment) HPI 28 year old male presents with family for worsening neurologic type symptoms. The patient is not able to provide a history. 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 They deny any specific diagnoses although recent charts show some diagnoses of cerebral palsy. Over the past 7 years he's had multiple episodes but they describe as an initial tensing of both arms, tremors, more difficulty speaking than normal with abnormal sounds in the interval walking. The symptoms can last for hours or a day. Occasionally he has had UTIs when having the symptoms. He's never had fevers and does not currently have a fever. He's been to many neurologic specialists and had multiple EEGs and MRIs without any clear indication of why he has these episodes. He's currently on clonidine and Depakote although family feels they're not helping. Over the past month he's been having more frequent episodes than normal. Typically has about 2-3 per month denies had 2 in the last week and 2 in the last 24 hours. He is currently having an episode and is unable to walk. He can move his legs but seems to be unable to bear weight when still it out. They relate that back in 2009 they came to the ER and he was given a shot of what they think was "atropine" because the doctor stated he looked like someone that have been affected by nerve gas (to their knowledge he had not or has not recently) and this seemed to clear his symptoms for several months. They are pretty sure it was atropine but he has not been given this since.   History reviewed. No pertinent past medical history. History reviewed. No pertinent past surgical history. No family history on file. History  Substance  Use Topics  . Smoking status: Never Smoker   . Smokeless tobacco: Not on file  . Alcohol Use: No    Review of Systems  Unable to perform ROS: Patient nonverbal      Allergies  Review of patient's allergies indicates no known allergies.  Home Medications   Prior to Admission medications   Medication Sig Start Date End Date Taking? Authorizing Provider  cloNIDine (CATAPRES) 0.1 MG tablet Take 1 tablet (0.1 mg total) by mouth at bedtime. 12/15/13 12/15/14  Archer AsaGerald Plovsky, MD  cloNIDine (CATAPRES) 0.1 MG tablet Take 1 tablet (0.1 mg total) by mouth at bedtime. 05/11/14   Archer AsaGerald Plovsky, MD  divalproex (DEPAKOTE ER) 250 MG 24 hr tablet Take 1 tablet (250 mg total) by mouth 2 (two) times daily. 09/02/12 09/02/13  Jorje GuildAlan Watt, PA-C  divalproex (DEPAKOTE ER) 250 MG 24 hr tablet TAKE 1 TABLET (250 MG TOTAL) BY MOUTH 2 (TWO) TIMES DAILY. 05/11/14   Archer AsaGerald Plovsky, MD   BP 156/89  Pulse 110  Temp(Src) 97.7 F (36.5 C)  Resp 24  SpO2 96% Physical Exam  Nursing note and vitals reviewed. Constitutional: He appears well-developed and well-nourished. No distress.  Patient smiling, occasionally points and makes high pitched noises. Does not appear in distress.  HENT:  Head: Normocephalic and atraumatic.  Right Ear: External ear normal.  Left Ear: External ear normal.  Nose: Nose normal.  Eyes: Right eye exhibits no discharge. Left eye exhibits no discharge.  Neck: Neck supple.  Cardiovascular: Regular rhythm, normal heart  sounds and intact distal pulses.  Tachycardia present.   Pulmonary/Chest: Effort normal and breath sounds normal.  Abdominal: Soft. He exhibits no distension. There is no tenderness.  Musculoskeletal: He exhibits no edema.  Neurological: He is alert.  Squeezes both hands. Moves bilateral legs spontaneously. Normal patellar reflexes. Normal tone. Mild tremor  Skin: Skin is warm and dry.    ED Course  Procedures (including critical care time) Labs Review Labs Reviewed   CBC WITH DIFFERENTIAL - Abnormal; Notable for the following:    RBC 5.85 (*)    Hemoglobin 17.1 (*)    All other components within normal limits  COMPREHENSIVE METABOLIC PANEL - Abnormal; Notable for the following:    Glucose, Bld 101 (*)    All other components within normal limits  URINALYSIS, ROUTINE W REFLEX MICROSCOPIC - Abnormal; Notable for the following:    APPearance CLOUDY (*)    All other components within normal limits  VALPROIC ACID LEVEL - Abnormal; Notable for the following:    Valproic Acid Lvl 24.6 (*)    All other components within normal limits    Imaging Review Dg Abd Acute W/chest  07/12/2014   CLINICAL DATA:  Abdominal discomfort, constipation  EXAM: ACUTE ABDOMEN SERIES (ABDOMEN 2 VIEW & CHEST 1 VIEW)  COMPARISON:  05/22/2008  FINDINGS: Low lung volumes. Normal heart size and vascularity. Negative for edema, pneumonia, collapse or consolidation. No effusion or pneumothorax. Trachea midline.  No definite free air. Scattered air and stool throughout the bowel. Moderate volume of Retained stool noted in the right colon as well as the rectum. Fecal impaction not excluded. No abnormal calcification or osseous finding.  IMPRESSION: No acute chest process  Negative for obstruction or free air  Retained stool in the rectum, possible fecal impaction.   Electronically Signed   By: Ruel Favors M.D.   On: 07/12/2014 16:46     EKG Interpretation None      MDM   Final diagnoses:  Tremor  Constipation, unspecified constipation type    Patient's blood work and urinalysis are unremarkable except for low Depakote level. Patient has no fever. He was given Ativan after discussion with family and he seems to be more relaxed than he states his tremor has resolved. I feel he is much better. He has not been on any benzodiazepines as they know of. I did discuss his case originally with neurology who was only recommending benzos for tremor but otherwise his history so complicated he  is to followup with neurology. This does not seem like an acute problem. Family does endorse constipation but states this is a chronic issue and is not particular worse now. He had a normal bowel movement yesterday. Given that his symptoms are better I feel he is stable for discharge. We'll give a small amount of Ativan at home for when necessary use. Family states only use if they feel his symptoms are severe. He is to followup with his PCP next week is ready scheduled. I feel there is likely some combination of agitation versus neuromuscular disorder. This does not seem to be a seizure as he is always awake during these episodes.    Audree Camel, MD 07/12/14 2120

## 2014-07-12 NOTE — ED Notes (Signed)
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 His normal episodes of "being out of control" are becoming almost constant today.

## 2014-07-12 NOTE — Discharge Instructions (Signed)
Tremor Tremor is a rhythmic, involuntary muscular contraction characterized by oscillations (to-and-fro movements) of a part of the body. The most common of all involuntary movements, tremor can affect various body parts such as the hands, head, facial structures, vocal cords, trunk, and legs; most tremors, however, occur in the hands. Tremor often accompanies neurological disorders associated with aging. Although the disorder is not life-threatening, it can be responsible for functional disability and social embarrassment. TREATMENT  There are many types of tremor and several ways in which tremor is classified. The most common classification is by behavioral context or position. There are five categories of tremor within this classification: resting, postural, kinetic, task-specific, and psychogenic. Resting or static tremor occurs when the muscle is at rest, for example when the hands are lying on the lap. This type of tremor is often seen in patients with Parkinson's disease. Postural tremor occurs when a patient attempts to maintain posture, such as holding the hands outstretched. Postural tremors include physiological tremor, essential tremor, tremor with basal ganglia disease (also seen in patients with Parkinson's disease), cerebellar postural tremor, tremor with peripheral neuropathy, post-traumatic tremor, and alcoholic tremor. Kinetic or intention (action) tremor occurs during purposeful movement, for example during finger-to-nose testing. Task-specific tremor appears when performing goal-oriented tasks such as handwriting, speaking, or standing. This group consists of primary writing tremor, vocal tremor, and orthostatic tremor. Psychogenic tremor occurs in both older and younger patients. The key feature of this tremor is that it dramatically lessens or disappears when the patient is distracted. PROGNOSIS There are some treatment options available for tremor; the appropriate treatment depends on  accurate diagnosis of the cause. Some tremors respond to treatment of the underlying condition, for example in some cases of psychogenic tremor treating the patient's underlying mental problem may cause the tremor to disappear. Also, patients with tremor due to Parkinson's disease may be treated with Levodopa drug therapy. Symptomatic drug therapy is available for several other tremors as well. For those cases of tremor in which there is no effective drug treatment, physical measures such as teaching the patient to brace the affected limb during the tremor are sometimes useful. Surgical intervention such as thalamotomy or deep brain stimulation may be useful in certain cases. Document Released: 09/20/2002 Document Revised: 12/23/2011 Document Reviewed: 09/30/2005 [facility] Patient Information 2015 [place], [place]. This information is not intended to replace advice given to you by your health care provider. Make sure you discuss any questions you have with your health care provider.    Constipation Constipation is when a person has fewer than three bowel movements a week, has difficulty having a bowel movement, or has stools that are dry, hard, or larger than normal. As people grow older, constipation is more common. If you try to fix constipation with medicines that make you have a bowel movement (laxatives), the problem may get worse. Long-term laxative use may cause the muscles of the colon to become weak. A low-fiber diet, not taking in enough fluids, and taking certain medicines may make constipation worse.  CAUSES   Certain medicines, such as antidepressants, pain medicine, iron supplements, antacids, and water pills.   Certain diseases, such as diabetes, irritable bowel syndrome (IBS), thyroid disease, or depression.   Not drinking enough water.   Not eating enough fiber-rich foods.   Stress or travel.   Lack of physical activity or exercise.   Ignoring the urge to have a bowel  movement.   Using laxatives too much.  SIGNS AND SYMPTOMS  Having fewer than three bowel movements a week.   Straining to have a bowel movement.   Having stools that are hard, dry, or larger than normal.   Feeling full or bloated.   Pain in the lower abdomen.   Not feeling relief after having a bowel movement.  DIAGNOSIS  Your health care provider will take a medical history and perform a physical exam. Further testing may be done for severe constipation. Some tests may include:  A barium enema X-ray to examine your rectum, colon, and, sometimes, your small intestine.   A sigmoidoscopy to examine your lower colon.   A colonoscopy to examine your entire colon. TREATMENT  Treatment will depend on the severity of your constipation and what is causing it. Some dietary treatments include drinking more fluids and eating more fiber-rich foods. Lifestyle treatments may include regular exercise. If these diet and lifestyle recommendations do not help, your health care provider may recommend taking over-the-counter laxative medicines to help you have bowel movements. Prescription medicines may be prescribed if over-the-counter medicines do not work.  HOME CARE INSTRUCTIONS   Eat foods that have a lot of fiber, such as fruits, vegetables, whole grains, and beans.  Limit foods high in fat and processed sugars, such as french fries, hamburgers, cookies, candies, and soda.   A fiber supplement may be added to your diet if you cannot get enough fiber from foods.   Drink enough fluids to keep your urine clear or pale yellow.   Exercise regularly or as directed by your health care provider.   Go to the restroom when you have the urge to go. Do not hold it.   Only take over-the-counter or prescription medicines as directed by your health care provider. Do not take other medicines for constipation without talking to your health care provider first.  SEEK IMMEDIATE MEDICAL CARE  IF:   You have bright red blood in your stool.   Your constipation lasts for more than 4 days or gets worse.   You have abdominal or rectal pain.   You have thin, pencil-like stools.   You have unexplained weight loss. MAKE SURE YOU:   Understand these instructions.  Will watch your condition.  Will get help right away if you are not doing well or get worse. Document Released: 06/28/2004 Document Revised: 10/05/2013 Document Reviewed: 07/12/2013 [facility] Patient Information 2015 [place], [place]. This information is not intended to replace advice given to you by your health care provider. Make sure you discuss any questions you have with your health care provider.

## 2014-09-23 ENCOUNTER — Emergency Department (HOSPITAL_COMMUNITY): Payer: 59

## 2014-09-23 ENCOUNTER — Encounter (HOSPITAL_COMMUNITY): Payer: Self-pay | Admitting: Emergency Medicine

## 2014-09-23 ENCOUNTER — Emergency Department (HOSPITAL_COMMUNITY)
Admission: EM | Admit: 2014-09-23 | Discharge: 2014-09-23 | Disposition: A | Discharge status: Home / Self Care | Payer: 59 | Attending: Emergency Medicine | Admitting: Emergency Medicine

## 2014-09-23 DIAGNOSIS — Y998 Other external cause status: Secondary | ICD-10-CM | POA: Insufficient documentation

## 2014-09-23 DIAGNOSIS — Y9301 Activity, walking, marching and hiking: Secondary | ICD-10-CM | POA: Diagnosis not present

## 2014-09-23 DIAGNOSIS — Z8659 Personal history of other mental and behavioral disorders: Secondary | ICD-10-CM | POA: Diagnosis not present

## 2014-09-23 DIAGNOSIS — S0993XA Unspecified injury of face, initial encounter: Secondary | ICD-10-CM | POA: Diagnosis present

## 2014-09-23 DIAGNOSIS — S0083XA Contusion of other part of head, initial encounter: Secondary | ICD-10-CM | POA: Diagnosis not present

## 2014-09-23 DIAGNOSIS — Z79899 Other long term (current) drug therapy: Secondary | ICD-10-CM | POA: Insufficient documentation

## 2014-09-23 DIAGNOSIS — W01198A Fall on same level from slipping, tripping and stumbling with subsequent striking against other object, initial encounter: Secondary | ICD-10-CM | POA: Diagnosis not present

## 2014-09-23 DIAGNOSIS — R000 Tachycardia, unspecified: Secondary | ICD-10-CM | POA: Insufficient documentation

## 2014-09-23 DIAGNOSIS — Y92009 Unspecified place in unspecified non-institutional (private) residence as the place of occurrence of the external cause: Secondary | ICD-10-CM | POA: Diagnosis not present

## 2014-09-23 DIAGNOSIS — W19XXXA Unspecified fall, initial encounter: Secondary | ICD-10-CM

## 2014-09-23 HISTORY — DX: Unspecified convulsions: R56.9

## 2014-09-23 HISTORY — DX: Unspecified intellectual disabilities: F79

## 2014-09-23 LAB — URINALYSIS, ROUTINE W REFLEX MICROSCOPIC
Bilirubin Urine: NEGATIVE
Glucose, UA: NEGATIVE mg/dL
Hgb urine dipstick: NEGATIVE
Ketones, ur: 15 mg/dL — AB
Leukocytes, UA: NEGATIVE
NITRITE: NEGATIVE
Protein, ur: NEGATIVE mg/dL
Specific Gravity, Urine: 1.013 (ref 1.005–1.030)
UROBILINOGEN UA: 0.2 mg/dL (ref 0.0–1.0)
pH: 7 (ref 5.0–8.0)

## 2014-09-23 LAB — CBC WITH DIFFERENTIAL/PLATELET
BASOS ABS: 0 10*3/uL (ref 0.0–0.1)
BASOS PCT: 0 % (ref 0–1)
EOS ABS: 0 10*3/uL (ref 0.0–0.7)
Eosinophils Relative: 0 % (ref 0–5)
HCT: 46.4 % (ref 39.0–52.0)
HEMOGLOBIN: 16.2 g/dL (ref 13.0–17.0)
Lymphocytes Relative: 10 % — ABNORMAL LOW (ref 12–46)
Lymphs Abs: 1.7 10*3/uL (ref 0.7–4.0)
MCH: 29.6 pg (ref 26.0–34.0)
MCHC: 34.9 g/dL (ref 30.0–36.0)
MCV: 84.8 fL (ref 78.0–100.0)
MONO ABS: 0.4 10*3/uL (ref 0.1–1.0)
MONOS PCT: 3 % (ref 3–12)
NEUTROS PCT: 87 % — AB (ref 43–77)
Neutro Abs: 14.5 10*3/uL — ABNORMAL HIGH (ref 1.7–7.7)
Platelets: 151 10*3/uL (ref 150–400)
RBC: 5.47 MIL/uL (ref 4.22–5.81)
RDW: 12.1 % (ref 11.5–15.5)
WBC: 16.7 10*3/uL — ABNORMAL HIGH (ref 4.0–10.5)

## 2014-09-23 LAB — BASIC METABOLIC PANEL
ANION GAP: 16 — AB (ref 5–15)
BUN: 12 mg/dL (ref 6–23)
CHLORIDE: 101 meq/L (ref 96–112)
CO2: 23 mEq/L (ref 19–32)
CREATININE: 0.7 mg/dL (ref 0.50–1.35)
Calcium: 9.1 mg/dL (ref 8.4–10.5)
GFR calc non Af Amer: >90 mL/min (ref >90)
Glucose, Bld: 112 mg/dL — ABNORMAL HIGH (ref 70–99)
POTASSIUM: 3.5 meq/L — AB (ref 3.7–5.3)
Sodium: 140 mEq/L (ref 137–147)

## 2014-09-23 MED ORDER — LORAZEPAM 2 MG/ML IJ SOLN
1.0000 mg | Freq: Once | INTRAMUSCULAR | Status: AC
  Administered 2014-09-23: 1 mg via INTRAMUSCULAR
  Filled 2014-09-23: qty 1

## 2014-09-23 NOTE — ED Notes (Signed)
Pt to x-ray and CT at this time.  Pt's father with pt.

## 2014-09-23 NOTE — ED Notes (Signed)
Pt to ED via [facility] after reported falling earlier today outside on grass.  Caregiver reported to EMS that pt stiffened up and then fell to ground.  Pt was helped up and walked into house and layed on the floor.  Pt usually crawls around the house but all he would do is lay there.  Pt has abrasion to right side of face and left side of head at hairline

## 2014-09-23 NOTE — ED Provider Notes (Signed)
CSN: [id number]     Arrival date & time 09/23/14  2008 History   First MD Initiated Contact with Patient 09/23/14 2008     Chief Complaint  Patient presents with  . 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 Patient fell down 4 steps after having one of his "episodes" where his body stiffens causing him to fall. He's had these episodes in the past which have been worked up, and not diagnosed as seizures. No loss of consciousness. Father states patient walked into the house with help, laid on the floor and did not want to crawl or walk around. He is normally ambulatory. He followed commands for EMS. Nonverbal at baseline. Level V caveat secondary to mental retardation.  Patient is a 28 y.o. male presenting with fall. The history is provided by the EMS personnel and a parent.  Fall    Past Medical History  Diagnosis Date  . Mental retardation   . Seizure    History reviewed. No pertinent past surgical history. No family history on file. History  Substance Use Topics  . Smoking status: Never Smoker   . Smokeless tobacco: Not on file  . Alcohol Use: No    Review of Systems  Unable to perform ROS: Patient nonverbal      Allergies  Review of patient's allergies indicates no known allergies.  Home Medications   Prior to Admission medications   Medication Sig Start Date End Date Taking? Authorizing Provider  cloNIDine (CATAPRES) 0.1 MG tablet Take 1 tablet (0.1 mg total) by mouth at bedtime. 05/11/14  Yes Archer AsaGerald Plovsky, MD  divalproex (DEPAKOTE ER) 250 MG 24 hr tablet TAKE 1 TABLET (250 MG TOTAL) BY MOUTH 2 (TWO) TIMES DAILY. 05/11/14  Yes Archer AsaGerald Plovsky, MD  LORazepam (ATIVAN) 1 MG tablet Take 1 tablet (1 mg total) by mouth every 6 (six) hours as needed (agitation, tremors).  07/12/14  Yes Audree CamelScott T Goldston, MD  omeprazole (PRILOSEC OTC) 20 MG tablet Take 20 mg by mouth daily.   Yes Historical Provider, MD  polyethylene glycol (MIRALAX / GLYCOLAX) packet Take 17 g by mouth at bedtime.   Yes Historical Provider, MD  cloNIDine (CATAPRES) 0.1 MG tablet Take 1 tablet (0.1 mg total) by mouth at bedtime. Patient not taking: Reported on 09/23/2014 12/15/13 12/15/14  Archer AsaGerald Plovsky, MD  divalproex (DEPAKOTE ER) 250 MG 24 hr tablet Take 1 tablet (250 mg total) by mouth 2 (two) times daily. 09/02/12 09/02/13  Jorje GuildAlan Watt, PA-C   BP 138/91 mmHg  Pulse 101  Temp(Src) 97.8 F (36.6 C) (Axillary)  Resp 23  Ht 5\' 8"  (1.727 m)  Wt 225 lb (102.059 kg)  BMI 34.22 kg/m2  SpO2 94% Physical Exam  Constitutional: He appears well-developed and well-nourished. No distress.  HENT:  Head: Normocephalic. Head is without Battle's sign.    Right Ear: No hemotympanum.  Left Ear: No hemotympanum.  Eyes: Conjunctivae and EOM are normal. Pupils are equal, round, and reactive to light.  Neck: Normal range of motion. Neck supple.  Cardiovascular: Regular rhythm, normal heart sounds and intact distal pulses.   Mild tachycardia.  Pulmonary/Chest: Effort normal and breath sounds normal. He exhibits tenderness.  Tenderness across chest wall. No bruising or signs of trauma. No crepitus.  Musculoskeletal: Normal range of motion. He exhibits no edema.  FROM all four extremities without evidence of pain. No  swelling or deformity. Cervical, thoracic and lumbar spinous process non-tender.  Neurological: He is alert.  Follows commands such as to roll to a side or hold up hands.  Skin: Skin is warm and dry.  Nursing note and vitals reviewed.   ED Course  Procedures (including critical care time) Labs Review Labs Reviewed  CBC WITH DIFFERENTIAL - Abnormal; Notable for the following:    WBC 16.7 (*)    Neutrophils Relative % 87 (*)    Neutro Abs 14.5 (*)    Lymphocytes Relative 10 (*)    All  other components within normal limits  BASIC METABOLIC PANEL - Abnormal; Notable for the following:    Potassium 3.5 (*)    Glucose, Bld 112 (*)    Anion gap 16 (*)    All other components within normal limits  URINALYSIS, ROUTINE W REFLEX MICROSCOPIC - Abnormal; Notable for the following:    Ketones, ur 15 (*)    All other components within normal limits    Imaging Review Dg Chest 1 View  09/23/2014   CLINICAL DATA:  Fall today.  Cough past 2 weeks.  EXAM: CHEST - 1 VIEW  COMPARISON:  07/12/2014  FINDINGS: Lungs are hypoinflated without focal consolidation or effusion. Cardiomediastinal silhouette and remainder of the exam is unchanged.  IMPRESSION: Hypoinflation without acute cardiopulmonary disease.   Electronically Signed   By: Elberta Fortisaniel  Boyle M.D.   On: 09/23/2014 22:01   Ct Head Wo Contrast  09/23/2014   CLINICAL DATA:  Seizure-like activity and fell face first down steps in to grasp. Abrasion to the right cheek and lateral right eye. The abrasion to the left side of the forehead.  EXAM: CT HEAD WITHOUT CONTRAST  CT MAXILLOFACIAL WITHOUT CONTRAST  TECHNIQUE: Multidetector CT imaging of the head and maxillofacial structures were performed using the standard protocol without intravenous contrast. Multiplanar CT image reconstructions of the maxillofacial structures were also generated.  COMPARISON:  MRI brain 06/07/2008.  CT head 01/24/2008.  FINDINGS: CT HEAD FINDINGS  Ventricles and sulci appear symmetrical. Cavum septum pellucidum. Calcification along the anterior falx. No mass effect or midline shift. No abnormal extra-axial fluid collections. Gray-white matter junctions are distinct. Basal cisterns are not effaced. No evidence of acute intracranial hemorrhage. No depressed skull fractures. Mastoid air cells are not opacified.  CT MAXILLOFACIAL FINDINGS  Infiltration in the subcutaneous fat over the right zygomatic arch and mild left periorbital soft tissue swelling suggesting contusion or  hematomas. The globes and extraocular muscles appear intact and symmetrical. The orbital bones, nasal bones, facial bones, zygomatic arches, pterygoid plates, mandibles, and temporomandibular joints appear intact. No displaced fractures are identified. Minimal fluid or thickening in the right maxillary antrum. Paranasal sinuses are otherwise clear.  IMPRESSION: No acute intracranial abnormalities. No displaced orbital or facial fractures identified.   Electronically Signed   By: Burman NievesWilliam  Stevens M.D.   On: 09/23/2014 22:34   Ct Maxillofacial Wo Cm  09/23/2014   CLINICAL DATA:  Seizure-like activity and fell face first down steps in to grasp. Abrasion to the right cheek and lateral right eye. The abrasion to the left side of the forehead.  EXAM: CT HEAD WITHOUT CONTRAST  CT MAXILLOFACIAL WITHOUT CONTRAST  TECHNIQUE: Multidetector CT imaging of the head and maxillofacial structures were performed using the standard protocol without intravenous contrast. Multiplanar CT image reconstructions of the maxillofacial structures were also generated.  COMPARISON:  MRI brain 06/07/2008.  CT head 01/24/2008.  FINDINGS: CT HEAD FINDINGS  Ventricles and sulci  appear symmetrical. Cavum septum pellucidum. Calcification along the anterior falx. No mass effect or midline shift. No abnormal extra-axial fluid collections. Gray-white matter junctions are distinct. Basal cisterns are not effaced. No evidence of acute intracranial hemorrhage. No depressed skull fractures. Mastoid air cells are not opacified.  CT MAXILLOFACIAL FINDINGS  Infiltration in the subcutaneous fat over the right zygomatic arch and mild left periorbital soft tissue swelling suggesting contusion or hematomas. The globes and extraocular muscles appear intact and symmetrical. The orbital bones, nasal bones, facial bones, zygomatic arches, pterygoid plates, mandibles, and temporomandibular joints appear intact. No displaced fractures are identified. Minimal fluid or  thickening in the right maxillary antrum. Paranasal sinuses are otherwise clear.  IMPRESSION: No acute intracranial abnormalities. No displaced orbital or facial fractures identified.   Electronically Signed   By: Burman Nieves M.D.   On: 09/23/2014 22:34     EKG Interpretation None      MDM   Final diagnoses:  Fall  Facial contusion, initial encounter   Patient nontoxic appearing, no apparent distress. Mild tachycardia, vitals otherwise stable. At baseline mentation. He's had these episodes multiple times in the past that have been worked up, no diagnosis of seizures. CT head and maxillofacial without any acute findings. Chest x-ray negative. Patient laying comfortably after receiving Ativan. He is stable for discharge home with PCP follow-up. Return precautions given. Parent states understanding of plan and is agreeable.  Discussed with attending Dr. Gwendolyn Grant who also evaluated patient and agrees with plan of care.   Kathrynn Speed, PA-C 09/23/14 2251  Elwin Mocha, MD 09/24/14 [phone]

## 2014-09-23 NOTE — Discharge Instructions (Signed)
Facial or Scalp Contusion A facial or scalp contusion is a deep bruise on the face or head. Injuries to the face and head generally cause a lot of swelling, especially around the eyes. Contusions are the result of an injury that caused bleeding under the skin. The contusion may turn blue, purple, or yellow. Minor injuries will give you a painless contusion, but more severe contusions may stay painful and swollen for a few weeks.  CAUSES  A facial or scalp contusion is caused by a blunt injury or trauma to the face or head area.  SIGNS AND SYMPTOMS   Swelling of the injured area.   Discoloration of the injured area.   Tenderness, soreness, or pain in the injured area.  DIAGNOSIS  The diagnosis can be made by taking a medical history and doing a physical exam. An X-ray exam, CT scan, or MRI may be needed to determine if there are any associated injuries, such as broken bones (fractures). TREATMENT  Often, the best treatment for a facial or scalp contusion is applying cold compresses to the injured area. Over-the-counter medicines may also be recommended for pain control.  HOME CARE INSTRUCTIONS   Only take over-the-counter or prescription medicines as directed by your health care provider.   Apply ice to the injured area.   Put ice in a plastic bag.   Place a towel between your skin and the bag.   Leave the ice on for 20 minutes, 2-3 times a day.  SEEK MEDICAL CARE IF:  You have bite problems.   You have pain with chewing.   You are concerned about facial defects. SEEK IMMEDIATE MEDICAL CARE IF:  You have severe pain or a headache that is not relieved by medicine.   You have unusual sleepiness, confusion, or personality changes.   You throw up (vomit).   You have a persistent nosebleed.   You have double vision or blurred vision.   You have fluid drainage from your nose or ear.   You have difficulty walking or using your arms or legs.  MAKE SURE YOU:    Understand these instructions.  Will watch your condition.  Will get help right away if you are not doing well or get worse. Document Released: 11/07/2004 Document Revised: 07/21/2013 Document Reviewed: 05/13/2013 [employer] Patient Information 2015 [employer]. This information is not intended to replace advice given to you by your health care provider. Make sure you discuss any questions you have with your health care provider.  

## 2014-09-24 ENCOUNTER — Emergency Department (HOSPITAL_COMMUNITY)
Admission: EM | Admit: 2014-09-24 | Discharge: 2014-09-24 | Disposition: A | Discharge status: Home / Self Care | Payer: 59 | Attending: Emergency Medicine | Admitting: Emergency Medicine

## 2014-09-24 ENCOUNTER — Emergency Department (HOSPITAL_COMMUNITY): Payer: 59

## 2014-09-24 ENCOUNTER — Encounter (HOSPITAL_COMMUNITY): Payer: Self-pay | Admitting: Emergency Medicine

## 2014-09-24 DIAGNOSIS — W109XXA Fall (on) (from) unspecified stairs and steps, initial encounter: Secondary | ICD-10-CM | POA: Diagnosis not present

## 2014-09-24 DIAGNOSIS — G40909 Epilepsy, unspecified, not intractable, without status epilepticus: Secondary | ICD-10-CM | POA: Diagnosis not present

## 2014-09-24 DIAGNOSIS — Y998 Other external cause status: Secondary | ICD-10-CM | POA: Diagnosis not present

## 2014-09-24 DIAGNOSIS — Y9389 Activity, other specified: Secondary | ICD-10-CM | POA: Diagnosis not present

## 2014-09-24 DIAGNOSIS — W102XXA Fall (on)(from) incline, initial encounter: Secondary | ICD-10-CM

## 2014-09-24 DIAGNOSIS — F79 Unspecified intellectual disabilities: Secondary | ICD-10-CM | POA: Insufficient documentation

## 2014-09-24 DIAGNOSIS — Y9289 Other specified places as the place of occurrence of the external cause: Secondary | ICD-10-CM | POA: Diagnosis not present

## 2014-09-24 DIAGNOSIS — Z79899 Other long term (current) drug therapy: Secondary | ICD-10-CM | POA: Diagnosis not present

## 2014-09-24 DIAGNOSIS — S62001A Unspecified fracture of navicular [scaphoid] bone of right wrist, initial encounter for closed fracture: Secondary | ICD-10-CM

## 2014-09-24 DIAGNOSIS — S62024A Nondisplaced fracture of middle third of navicular [scaphoid] bone of right wrist, initial encounter for closed fracture: Secondary | ICD-10-CM | POA: Diagnosis not present

## 2014-09-24 DIAGNOSIS — S6991XA Unspecified injury of right wrist, hand and finger(s), initial encounter: Secondary | ICD-10-CM | POA: Diagnosis present

## 2014-09-24 LAB — URINALYSIS, ROUTINE W REFLEX MICROSCOPIC
Bilirubin Urine: NEGATIVE
GLUCOSE, UA: NEGATIVE mg/dL
Hgb urine dipstick: NEGATIVE
KETONES UR: 15 mg/dL — AB
LEUKOCYTES UA: NEGATIVE
Nitrite: NEGATIVE
Protein, ur: NEGATIVE mg/dL
Specific Gravity, Urine: 1.024 (ref 1.005–1.030)
UROBILINOGEN UA: 0.2 mg/dL (ref 0.0–1.0)
pH: 7 (ref 5.0–8.0)

## 2014-09-24 LAB — BASIC METABOLIC PANEL
Anion gap: 15 (ref 5–15)
BUN: 12 mg/dL (ref 6–23)
CALCIUM: 9.2 mg/dL (ref 8.4–10.5)
CO2: 22 mEq/L (ref 19–32)
Chloride: 101 mEq/L (ref 96–112)
Creatinine, Ser: 0.7 mg/dL (ref 0.50–1.35)
GLUCOSE: 105 mg/dL — AB (ref 70–99)
Potassium: 3.6 mEq/L — ABNORMAL LOW (ref 3.7–5.3)
SODIUM: 138 meq/L (ref 137–147)

## 2014-09-24 LAB — CBC WITH DIFFERENTIAL/PLATELET
BASOS ABS: 0 10*3/uL (ref 0.0–0.1)
BASOS PCT: 0 % (ref 0–1)
EOS ABS: 0 10*3/uL (ref 0.0–0.7)
Eosinophils Relative: 0 % (ref 0–5)
HCT: 45.9 % (ref 39.0–52.0)
Hemoglobin: 16.2 g/dL (ref 13.0–17.0)
LYMPHS ABS: 1.6 10*3/uL (ref 0.7–4.0)
Lymphocytes Relative: 14 % (ref 12–46)
MCH: 29.5 pg (ref 26.0–34.0)
MCHC: 35.3 g/dL (ref 30.0–36.0)
MCV: 83.5 fL (ref 78.0–100.0)
Monocytes Absolute: 1.2 10*3/uL — ABNORMAL HIGH (ref 0.1–1.0)
Monocytes Relative: 11 % (ref 3–12)
NEUTROS PCT: 75 % (ref 43–77)
Neutro Abs: 8.6 10*3/uL — ABNORMAL HIGH (ref 1.7–7.7)
PLATELETS: 154 10*3/uL (ref 150–400)
RBC: 5.5 MIL/uL (ref 4.22–5.81)
RDW: 12.1 % (ref 11.5–15.5)
WBC: 11.5 10*3/uL — ABNORMAL HIGH (ref 4.0–10.5)

## 2014-09-24 MED ORDER — HYDROCODONE-ACETAMINOPHEN 5-325 MG PO TABS: 1.0000 | ORAL_TABLET | ORAL | Status: DC | PRN

## 2014-09-24 MED ORDER — LORAZEPAM 2 MG/ML IJ SOLN
1.0000 mg | Freq: Once | INTRAMUSCULAR | Status: AC
Start: 2014-09-24 — End: 2014-09-24
  Administered 2014-09-24: 1 mg via INTRAMUSCULAR
  Filled 2014-09-24: qty 1

## 2014-09-24 NOTE — ED Notes (Signed)
Paged ortho 

## 2014-09-24 NOTE — ED Notes (Signed)
PA  Lisa at bedside. 

## 2014-09-24 NOTE — Discharge Instructions (Signed)
Take the prescribed medication as directed for pain. Follow-up with Dr. Melvyn Novasrtmann-- call to schedule follow-up appt. Return to the ED for new or worsening symptoms.

## 2014-09-24 NOTE — ED Provider Notes (Signed)
CSN: [id number]     Arrival date & time 09/24/14  1111 History   First MD Initiated Contact with Patient 09/24/14 1118     Chief Complaint  Patient presents with  . Fall  . Arm Injury     (Consider location/radiation/quality/duration/timing/severity/associated sxs/prior Treatment) The history is provided by the patient and medical records.     LEVEL 5 CAVEAT:  MENTAL RETARDATION/NON-VERBAL This is a 28 year old male with past medical history significant for mental retardation , presenting to the ED following a fall which happened yesterday. Patient was seen in the ED yesterday-- apparently fell down 4 steps into the grass after having an episode of "body stiffening" which caused him to fall. He did hit his head but did not lose consciousness.  Patient had CT head and maxillofacial yesterday which was negative for acute findings. Caregivers report since returning home, he has not made any effort to walk or move around. At baseline he is ambulatory and is generally always wanting to walk around.  They state they helped him stand this morning but he appears to "not be able to support his own body weight" like normal and has not been moving his right arm very much.  They report he yells when his right wrist/hand are touched which usually indicates pain.  Family expresses concern for possible stroke.  Patient is non-verbal at baseline, states his mental state is currently at baseline.  He has no appeared to have any back pain and continues moving his lower extremities when lying down.  No loss of bowel or bladder control.  Past Medical History  Diagnosis Date  . Mental retardation   . Seizure    Past Surgical History  Procedure Laterality Date  . Hernia repair      umbilical   No family history on file. History  Substance Use Topics  . Smoking status: Never Smoker   . Smokeless tobacco: Not on file  . Alcohol Use: No    Review of Systems  Unable to perform ROS: Patient nonverbal       Allergies  Review of patient's allergies indicates no known allergies.  Home Medications   Prior to Admission medications   Medication Sig Start Date End Date Taking? Authorizing Provider  cloNIDine (CATAPRES) 0.1 MG tablet Take 1 tablet (0.1 mg total) by mouth at bedtime. 05/11/14  Yes Archer AsaGerald Plovsky, MD  divalproex (DEPAKOTE ER) 250 MG 24 hr tablet TAKE 1 TABLET (250 MG TOTAL) BY MOUTH 2 (TWO) TIMES DAILY. 05/11/14  Yes Archer AsaGerald Plovsky, MD  LORazepam (ATIVAN) 1 MG tablet Take 1 tablet (1 mg total) by mouth every 6 (six) hours as needed (agitation, tremors). 07/12/14  Yes Audree CamelScott T Goldston, MD  omeprazole (PRILOSEC OTC) 20 MG tablet Take 20 mg by mouth daily.   Yes Historical Provider, MD  polyethylene glycol (MIRALAX / GLYCOLAX) packet Take 17 g by mouth at bedtime.   Yes Historical Provider, MD  cloNIDine (CATAPRES) 0.1 MG tablet Take 1 tablet (0.1 mg total) by mouth at bedtime. Patient not taking: Reported on 09/23/2014 12/15/13 12/15/14  Archer AsaGerald Plovsky, MD  divalproex (DEPAKOTE ER) 250 MG 24 hr tablet Take 1 tablet (250 mg total) by mouth 2 (two) times daily. 09/02/12 09/02/13  Jorje GuildAlan Watt, PA-C   BP 114/78 mmHg  Temp(Src) 98 F (36.7 C) (Oral)  Resp 22  SpO2 96%   Physical Exam  Constitutional: He appears well-developed and well-nourished.  HENT:  Head: Normocephalic and atraumatic.  Mouth/Throat: Oropharynx is clear and moist.  Eyes: Conjunctivae and EOM are normal. Pupils are equal, round, and reactive to light.  Pupils asymmetric (right approx 6mm, left approx 4mm) but reactive bilaterally  Neck: Normal range of motion.  Cardiovascular: Normal rate, regular rhythm and normal heart sounds.   Pulmonary/Chest: Effort normal and breath sounds normal.  Abdominal: Soft. Bowel sounds are normal.  Musculoskeletal: Normal range of motion.  Neurological: He is alert.  Awake, alert; moving left arm and bilateral lower extremities without difficulty; limited movement of right arm but  yells and withdraws from pain when right arm is touched; gait not tested  Skin: Skin is warm and dry.  Psychiatric: He has a normal mood and affect.  Nursing note and vitals reviewed.   ED Course  Procedures (including critical care time) Labs Review Labs Reviewed  CBC WITH DIFFERENTIAL - Abnormal; Notable for the following:    WBC 11.5 (*)    Neutro Abs 8.6 (*)    Monocytes Absolute 1.2 (*)    All other components within normal limits  BASIC METABOLIC PANEL - Abnormal; Notable for the following:    Potassium 3.6 (*)    Glucose, Bld 105 (*)    All other components within normal limits  URINALYSIS, ROUTINE W REFLEX MICROSCOPIC - Abnormal; Notable for the following:    Ketones, ur 15 (*)    All other components within normal limits    Imaging Review Dg Chest 1 View  09/23/2014   CLINICAL DATA:  Fall today.  Cough past 2 weeks.  EXAM: CHEST - 1 VIEW  COMPARISON:  07/12/2014  FINDINGS: Lungs are hypoinflated without focal consolidation or effusion. Cardiomediastinal silhouette and remainder of the exam is unchanged.  IMPRESSION: Hypoinflation without acute cardiopulmonary disease.   Electronically Signed   By: Elberta Fortis M.D.   On: 09/23/2014 22:01   Dg Wrist Complete Right  09/24/2014   CLINICAL DATA:  The patient fell last night and landed on his right side. X-rays were cleared. This morning was favoring the left arm and seemed to be in pain in the right arm. The patient is mentally retarded and unable to fully communicate pain.  EXAM: RIGHT WRIST - COMPLETE 3+ VIEW  COMPARISON:  None.  FINDINGS: There is a transverse minimally displaced fracture of the scaphoid. Intercarpal spaces are normal. There is mild soft tissue swelling along the radial side of the wrist.  IMPRESSION: Transverse fracture of the scaphoid.   Electronically Signed   By: Rosalie Gums M.D.   On: 09/24/2014 13:06   Ct Head Wo Contrast  09/23/2014   CLINICAL DATA:  Seizure-like activity and fell face first down  steps in to grasp. Abrasion to the right cheek and lateral right eye. The abrasion to the left side of the forehead.  EXAM: CT HEAD WITHOUT CONTRAST  CT MAXILLOFACIAL WITHOUT CONTRAST  TECHNIQUE: Multidetector CT imaging of the head and maxillofacial structures were performed using the standard protocol without intravenous contrast. Multiplanar CT image reconstructions of the maxillofacial structures were also generated.  COMPARISON:  MRI brain 06/07/2008.  CT head 01/24/2008.  FINDINGS: CT HEAD FINDINGS  Ventricles and sulci appear symmetrical. Cavum septum pellucidum. Calcification along the anterior falx. No mass effect or midline shift. No abnormal extra-axial fluid collections. Gray-white matter junctions are distinct. Basal cisterns are not effaced. No evidence of acute intracranial hemorrhage. No depressed skull fractures. Mastoid air cells are not opacified.  CT MAXILLOFACIAL FINDINGS  Infiltration in the subcutaneous fat over the right zygomatic arch and mild left periorbital  soft tissue swelling suggesting contusion or hematomas. The globes and extraocular muscles appear intact and symmetrical. The orbital bones, nasal bones, facial bones, zygomatic arches, pterygoid plates, mandibles, and temporomandibular joints appear intact. No displaced fractures are identified. Minimal fluid or thickening in the right maxillary antrum. Paranasal sinuses are otherwise clear.  IMPRESSION: No acute intracranial abnormalities. No displaced orbital or facial fractures identified.   Electronically Signed   By: Burman Nieves M.D.   On: 09/23/2014 22:34   Ct Cervical Spine Wo Contrast  09/24/2014   CLINICAL DATA:  28 year old mentally challenged male who fell down stairs at home yesterday, landing on the right side of the body. Typically the patient is ambulatory, but he has been unable to walk since the fall.  EXAM: CT CERVICAL SPINE WITHOUT CONTRAST  TECHNIQUE: Multidetector CT imaging of the cervical spine was  performed without intravenous contrast. Multiplanar CT image reconstructions were also generated.  COMPARISON:  None.  FINDINGS: No fractures identified involving the cervical spine. Sagittal reconstructed images demonstrate anatomic posterior alignment. No evidence of spinal stenosis. Neural foramina widely patent throughout. Facet joints intact throughout. Disc spaces well preserved, and no significant disc protrusion identified on the soft tissue windows. Coronal reformatted images demonstrate an intact craniocervical junction, intact dens and intact lateral masses throughout.  IMPRESSION: Normal examination.   Electronically Signed   By: Hulan Saas M.D.   On: 09/24/2014 13:28   Mr Brain Wo Contrast  09/24/2014   CLINICAL DATA:  Fall from incline, initial encounter. Fall yesterday. Possible left arm weakness.  EXAM: MRI HEAD WITHOUT CONTRAST  TECHNIQUE: Multiplanar, multiecho pulse sequences of the brain and surrounding structures were obtained without intravenous contrast.  COMPARISON:  Head CT 09/23/2014 and MRI 06/07/2008  FINDINGS: Images are mildly to moderately degraded by motion artifact.  There is no evidence of acute infarct, intracranial hemorrhage, intra-axial mass, midline shift, or extra-axial fluid collection. Prominent ossification is again noted along the anterior falx with a small coexistent left parafalcine meningioma again not excluded, overall unchanged in appearance to the prior MRI. Cavum septum pellucidum has decreased in size from the prior MRI. Arachnoid cyst in the left middle cranial fossa measures 4.5 x 2.4 cm, unchanged. No significant white matter disease is seen. Overall cerebral volume is within normal limits for age.  Orbits are unremarkable. Major intracranial vascular flow voids are preserved. The paranasal sinuses and mastoid air cells are clear.  IMPRESSION: 1. No acute intracranial abnormality. 2. Unchanged left middle cranial fossa arachnoid cyst.   Electronically  Signed   By: Sebastian Ache   On: 09/24/2014 15:12   Dg Hand Complete Right  09/24/2014   CLINICAL DATA:  28 year old mentally challenged male who fell last night, landing on the right side. Today he is favoring the right hand and wrist. Initial encounter.  EXAM: RIGHT HAND - COMPLETE 3+ VIEW  COMPARISON:  None.  FINDINGS: Nondisplaced fracture through the mid pole of the scaphoid bone of the wrist, detailed on the report of the concurrent wrist imaging. No acute fractures involving the bones of the hand. Well preserved joint spaces. Well preserved bone mineral density.  IMPRESSION: 1. No fractures identified involving the bones of the hand. 2. Nondisplaced fracture through the mid pole of the scaphoid bone of the wrist (please see the report of concurrent wrist imaging for details).   Electronically Signed   By: Hulan Saas M.D.   On: 09/24/2014 13:02   Ct Maxillofacial Wo Cm  09/23/2014   CLINICAL  DATA:  Seizure-like activity and fell face first down steps in to grasp. Abrasion to the right cheek and lateral right eye. The abrasion to the left side of the forehead.  EXAM: CT HEAD WITHOUT CONTRAST  CT MAXILLOFACIAL WITHOUT CONTRAST  TECHNIQUE: Multidetector CT imaging of the head and maxillofacial structures were performed using the standard protocol without intravenous contrast. Multiplanar CT image reconstructions of the maxillofacial structures were also generated.  COMPARISON:  MRI brain 06/07/2008.  CT head 01/24/2008.  FINDINGS: CT HEAD FINDINGS  Ventricles and sulci appear symmetrical. Cavum septum pellucidum. Calcification along the anterior falx. No mass effect or midline shift. No abnormal extra-axial fluid collections. Gray-white matter junctions are distinct. Basal cisterns are not effaced. No evidence of acute intracranial hemorrhage. No depressed skull fractures. Mastoid air cells are not opacified.  CT MAXILLOFACIAL FINDINGS  Infiltration in the subcutaneous fat over the right zygomatic  arch and mild left periorbital soft tissue swelling suggesting contusion or hematomas. The globes and extraocular muscles appear intact and symmetrical. The orbital bones, nasal bones, facial bones, zygomatic arches, pterygoid plates, mandibles, and temporomandibular joints appear intact. No displaced fractures are identified. Minimal fluid or thickening in the right maxillary antrum. Paranasal sinuses are otherwise clear.  IMPRESSION: No acute intracranial abnormalities. No displaced orbital or facial fractures identified.   Electronically Signed   By: Burman NievesWilliam  Stevens M.D.   On: 09/23/2014 22:34     EKG Interpretation None      MDM   Final diagnoses:  Fall (on)(from) incline, initial encounter  Scaphoid fracture of wrist, right, closed, initial encounter   28 year old male with fall yesterday. Patient is nonverbal at baseline, history provided by parents at bedside. Seen in the ED and had CT head and max face performed which was negative for acute findings. They report today he "isn't unable to support his body weight" like normal and has not been moving his right arm. They expressed concern for potential stroke.  On exam, patient yells and withdraws from pain when right wrist is touched. There are no gross deformities and hand remains neurovascularly intact.  Some discrepancy of pupil size with R > L.  He is moving all his extremities well, no issues with bowel or bladder control.  Will obtain CT cervical spine, imaging of right hand/wrist, and MR brain.  MR brain negative for acute findings.  CT cervical spine negative.  Right wrist with minimally-displaced scaphoid fracture, no other injuries noted.  Lab work again reassuring.  Patient's neurologic exam remains unchanged.  Likely limited movement of right arm due to pain from scaphoid fracture.  Patient placed in thumb spica, given hand surgery follow-up.  Short supply of pain meds for home.  Discussed plan with patient, he/she acknowledged  understanding and agreed with plan of care.  Return precautions given for new or worsening symptoms.  Case discussed with attending physician, Dr. Juleen ChinaKohut, who evaluated patient and agrees with assessment and plan of care.  Garlon HatchetLisa M Undra Harriman, PA-C 09/24/14 [phone]  Raeford RazorStephen Kohut, MD 09/25/14 [phone]

## 2014-09-24 NOTE — Progress Notes (Signed)
Orthopedic Tech Progress Note Patient Details:  Jay Krueger 04/11/1986 [id number]  Ortho Devices Type of Ortho Device: Thumb spica splint Splint Material: Fiberglass Ortho Device/Splint Location: RUE Ortho Device/Splint Interventions: Ordered, Application   Jennye MoccasinHughes, Aliea Bobe Craig 09/24/2014, 3:53 PM

## 2014-09-24 NOTE — ED Notes (Signed)
Pt from home, seen yesterday here for a fall down 5 flights of stairs. Pt fell on RT side of body. Pt had CT and xray of chest yesterday. At baseline, pt can walk and move all extremities, but is nonverbal. Pt's caregivers states that since the fall, he is unable to walk or move RT arm. Family denies any n/v, or change in behavior/LOC. NAD noted. Pt cries out when RT extremity is moved.

## 2014-11-16 ENCOUNTER — Ambulatory Visit (HOSPITAL_COMMUNITY): Payer: Self-pay | Admitting: Psychiatry

## 2015-02-02 ENCOUNTER — Emergency Department (HOSPITAL_BASED_OUTPATIENT_CLINIC_OR_DEPARTMENT_OTHER)
Admission: EM | Admit: 2015-02-02 | Discharge: 2015-02-02 | Disposition: A | Discharge status: Home / Self Care | Payer: 59 | Attending: Emergency Medicine | Admitting: Emergency Medicine

## 2015-02-02 ENCOUNTER — Encounter (HOSPITAL_BASED_OUTPATIENT_CLINIC_OR_DEPARTMENT_OTHER): Payer: Self-pay | Admitting: *Deleted

## 2015-02-02 DIAGNOSIS — R569 Unspecified convulsions: Secondary | ICD-10-CM

## 2015-02-02 DIAGNOSIS — G40909 Epilepsy, unspecified, not intractable, without status epilepticus: Secondary | ICD-10-CM | POA: Insufficient documentation

## 2015-02-02 DIAGNOSIS — Z79899 Other long term (current) drug therapy: Secondary | ICD-10-CM | POA: Diagnosis not present

## 2015-02-02 DIAGNOSIS — R52 Pain, unspecified: Secondary | ICD-10-CM | POA: Diagnosis present

## 2015-02-02 DIAGNOSIS — F79 Unspecified intellectual disabilities: Secondary | ICD-10-CM | POA: Diagnosis not present

## 2015-02-02 LAB — COMPREHENSIVE METABOLIC PANEL
ALT: 26 U/L (ref 0–53)
AST: 27 U/L (ref 0–37)
Albumin: 4.3 g/dL (ref 3.5–5.2)
Alkaline Phosphatase: 68 U/L (ref 39–117)
Anion gap: 7 (ref 5–15)
BUN: 14 mg/dL (ref 6–23)
CALCIUM: 8.7 mg/dL (ref 8.4–10.5)
CHLORIDE: 102 mmol/L (ref 96–112)
CO2: 29 mmol/L (ref 19–32)
Creatinine, Ser: 0.73 mg/dL (ref 0.50–1.35)
GFR calc Af Amer: >90 mL/min (ref >90)
GLUCOSE: 92 mg/dL (ref 70–99)
Potassium: 3.8 mmol/L (ref 3.5–5.1)
SODIUM: 138 mmol/L (ref 135–145)
Total Bilirubin: 0.5 mg/dL (ref 0.3–1.2)
Total Protein: 7.6 g/dL (ref 6.0–8.3)

## 2015-02-02 LAB — CBC
HCT: 49.3 % (ref 39.0–52.0)
Hemoglobin: 16.9 g/dL (ref 13.0–17.0)
MCH: 28.7 pg (ref 26.0–34.0)
MCHC: 34.3 g/dL (ref 30.0–36.0)
MCV: 83.7 fL (ref 78.0–100.0)
PLATELETS: 190 10*3/uL (ref 150–400)
RBC: 5.89 MIL/uL — ABNORMAL HIGH (ref 4.22–5.81)
RDW: 12.8 % (ref 11.5–15.5)
WBC: 8.8 10*3/uL (ref 4.0–10.5)

## 2015-02-02 LAB — I-STAT CG4 LACTIC ACID, ED: LACTIC ACID, VENOUS: 1.46 mmol/L (ref 0.5–2.0)

## 2015-02-02 LAB — AMMONIA: Ammonia: 31 umol/L (ref 11–32)

## 2015-02-02 LAB — CK: Total CK: 166 U/L (ref 7–232)

## 2015-02-02 LAB — MAGNESIUM: MAGNESIUM: 2.4 mg/dL (ref 1.5–2.5)

## 2015-02-02 LAB — TSH: TSH: 1.585 u[IU]/mL (ref 0.350–4.500)

## 2015-02-02 NOTE — ED Notes (Signed)
Not able to get discharge vitals .  Pt hx of mental retardation.

## 2015-02-02 NOTE — ED Notes (Signed)
[facility] have blood drawn during an episode of no muscle control. He has been having an episode tonight for the past 2 hours.

## 2015-02-02 NOTE — Discharge Instructions (Signed)
Your labs today were all normal, results posted at the bottom. Please follow up with your Neurologist at [facility].  Epilepsy Epilepsy is a disorder in which a person has repeated seizures over time. A seizure is a release of abnormal electrical activity in the brain. Seizures can cause a change in attention, behavior, or the ability to remain awake and alert (altered mental status). Seizures often involve uncontrollable shaking (convulsions).  Most people with epilepsy lead normal lives. However, people with epilepsy are at an increased risk of falls, accidents, and injuries. Therefore, it is important to begin treatment right away. CAUSES  Epilepsy has many possible causes. Anything that disturbs the normal pattern of brain cell activity can lead to seizures. This may include:   Head injury.  Birth trauma.  High fever as a child.  Stroke.  Bleeding into or around the brain.  Certain drugs.  Prolonged low oxygen, such as what occurs after CPR efforts.  Abnormal brain development.  Certain illnesses, such as meningitis, encephalitis (brain infection), malaria, and other infections.  An imbalance of nerve signaling chemicals (neurotransmitters).  SIGNS AND SYMPTOMS  The symptoms of a seizure can vary greatly from one person to another. Right before a seizure, you may have a warning (aura) that a seizure is about to occur. An aura may include the following symptoms:  Fear or anxiety.  Nausea.  Feeling like the room is spinning (vertigo).  Vision changes, such as seeing flashing lights or spots. Common symptoms during a seizure include:  Abnormal sensations, such as an abnormal smell or a bitter taste in the mouth.   Sudden, general body stiffness.   Convulsions that involve rhythmic jerking of the face, arm, or leg on one or both sides.   Sudden change in consciousness.   Appearing to be awake but not responding.   Appearing to be asleep but cannot be awakened.    Grimacing, chewing, lip smacking, drooling, tongue biting, or loss of bowel or bladder control. After a seizure, you may feel sleepy for a while. DIAGNOSIS  Your health care provider will ask about your symptoms and take a medical history. Descriptions from any witnesses to your seizures will be very helpful in the diagnosis. A physical exam, including a detailed neurological exam, is necessary. Various tests may be done, such as:   An electroencephalogram (EEG). This is a painless test of your brain waves. In this test, a diagram is created of your brain waves. These diagrams can be interpreted by a specialist.  An MRI of the brain.   A CT scan of the brain.   A spinal tap (lumbar puncture, LP).  Blood tests to check for signs of infection or abnormal blood chemistry. TREATMENT  There is no cure for epilepsy, but it is generally treatable. Once epilepsy is diagnosed, it is important to begin treatment as soon as possible. For most people with epilepsy, seizures can be controlled with medicines. The following may also be used:  A pacemaker for the brain (vagus nerve stimulator) can be used for people with seizures that are not well controlled by medicine.  Surgery on the brain. For some people, epilepsy eventually goes away. HOME CARE INSTRUCTIONS   Follow your health care provider's recommendations on driving and safety in normal activities.  Get enough rest. Lack of sleep can cause seizures.  Only take over-the-counter or prescription medicines as directed by your health care provider. Take any prescribed medicine exactly as directed.  Avoid any known triggers of your  seizures.  Keep a seizure diary. Record what you recall about any seizure, especially any possible trigger.   Make sure the people you live and work with know that you are prone to seizures. They should receive instructions on how to help you. In general, a witness to a seizure should:   Cushion your head  and body.   Turn you on your side.   Avoid unnecessarily restraining you.   Not place anything inside your mouth.   Call for emergency medical help if there is any question about what has occurred.   Follow up with your health care provider as directed. You may need regular blood tests to monitor the levels of your medicine.  SEEK MEDICAL CARE IF:   You develop signs of infection or other illness. This might increase the risk of a seizure.   You seem to be having more frequent seizures.   Your seizure pattern is changing.  SEEK IMMEDIATE MEDICAL CARE IF:   You have a seizure that does not stop after a few moments.   You have a seizure that causes any difficulty in breathing.   You have a seizure that results in a very severe headache.   You have a seizure that leaves you with the inability to speak or use a part of your body.  Document Released: 09/30/2005 Document Revised: 07/21/2013 Document Reviewed: 05/12/2013 [facility] Patient Information 2015 [place], [place]. This information is not intended to replace advice given to you by your health care provider. Make sure you discuss any questions you have with your health care provider.    I-Stat CG4 Lactic Acid, ED (Final result) Component (Lab Inquiry)    Collection Time Result Time Lactic Acid, Venous   02/02/15 20:08:00 02/02/15 20:12:08 1.46             CBC (Final result)Abnormal Component (Lab Inquiry)    Collection Time Result Time WBC RBC HGB HCT MCV MCH MCHC RDW PLT   02/02/15 20:00:00 02/02/15 20:11:41 8.8 5.89 (H) 16.9 49.3 83.7 28.7 34.3 12.8 190   Previous Results    09/24/14 11:53:00 09/24/14 12:23:42 11.5 (H) 5.50 16.2 45.9 83.5 29.5 35.3 12.1 154   09/23/14 21:14:00 09/23/14 21:34:00 16.7 (H) 5.47 16.2 46.4 84.8 29.6 34.9 12.1 151   07/12/14 15:35:00 07/12/14 15:45:30 8.9 5.85 (H) 17.1 (H) 48.8 83.4 29.2 35.0 12.5 189   05/22/08 20:15:00 07/12/11 15:18:41 10.7 (H) 6.10 (H) 17.8  (H) 52.7 (H) 86.4  33.8 12.1 227   01/24/08 09:54:00 07/09/11 11:12:08   16.7 49.0                  Comprehensive metabolic panel (Final result) Component (Lab Inquiry)    Collection Time Result Time NA K CL CO2 GLUCOSE BUN Creatinine, Ser CALCIUM PROTEIN Albumin   02/02/15 20:00:00 02/02/15 20:33:06 138 3.8 102 29 92 14 0.73 8.7 7.6 4.3   Previous Results    09/24/14 11:53:00 09/24/14 12:46:57 138 3.6 (L) 101 22 105 (H) 12 0.70 9.2     09/23/14 21:14:00 09/23/14 22:12:55 140 3.5 (L) 101 23 112 (H) 12 0.70 9.1     07/12/14 15:35:00 07/12/14 16:25:58 142 4.0 103 26 101 (H) 13 0.70 9.6 8.1 4.2   05/22/08 20:15:00 07/12/11 15:18:41 139 3.6 105 27 111 (H) 8 0.83 9.4 7.9 4.5   01/24/08 09:54:00 07/09/11 11:12:08 143 3.8 105  105 (H) 10 1.0         Collection Time Result Time AST ALT ALK PHOS  BILI TOTL GFR calc non Af Amer GFR calc Af Amer Anion gap   02/02/15 20:00:00 02/02/15 20:33:06 27 26 68 0.5 >90  >90    (Comment)    (NOTE)  The eGFR has been calculated using the CKD EPI equation.  This calculation has not been validated in all clinical situations.  eGFR's persistently <90 mL/min signify possible Chronic Kidney  Disease.       7   Previous Results    09/24/14 11:53:00 09/24/14 12:46:57     >90  >90    (Comment)    (NOTE)  The eGFR has been calculated using the CKD EPI equation.  This calculation has not been validated in all clinical situations.  eGFR's persistently <90 mL/min signify possible Chronic Kidney  Disease.       15   09/23/14 21:14:00 09/23/14 22:12:55     >90  >90    (Comment)    (NOTE)  The eGFR has been calculated using the CKD EPI equation.  This calculation has not been validated in all clinical situations.  eGFR's persistently <90 mL/min signify possible Chronic Kidney  Disease.       16 (H)   07/12/14 15:35:00 07/12/14 16:25:58 21 20 72 0.4 >90  >90    (Comment)    (NOTE) The eGFR has been calculated using  the CKD EPI equation. This calculation has not been validated in all clinical situations. eGFR's persistently <90 mL/min signify possible Chronic Kidney Disease.     13   05/22/08 20:15:00 07/12/11 15:18:41 27 23 85 0.9 >60 >60     The eGFR has been calculated using the MDRD equation. This calculation has not been validated in all clinical    01/24/08 09:54:00 07/09/11 11:12:08                    Ammonia (Final result) Component (Lab Inquiry)    Collection Time Result Time AMMONIA   02/02/15 20:00:00 02/02/15 20:33:25 31             CK (Final result) Component (Lab Inquiry)    Collection Time Result Time CK, TOTAL   02/02/15 20:00:00 02/02/15 20:33:06 166             T4, free (In process) Result time: 02/02/15 20:07:05      Magnesium (Final result) Component (Lab Inquiry)    Collection Time Result Time MG   02/02/15 20:00:00 02/02/15 20:33:06 2.4

## 2015-02-02 NOTE — ED Provider Notes (Signed)
CSN: [id number]     Arrival date & time 02/02/15  1840 History  This chart was scribed for Jay Krueger Agustina Witzke, MD by [name], ED Scribe. This patient was seen in room MH05/MH05 and the patient's care was started at 7:40 PM.    Chief Complaint  Patient presents with  . Follow-up   Patient is a 29 y.o. male presenting with musculoskeletal pain and general illness. The history is provided by the patient. No language interpreter was used.  Muscle Pain Pertinent negatives include no shortness of breath.  Illness Location:  Diffuse Quality:  Tremors, decreased muscle tone, seizure like episodes Severity:  Moderate Onset quality:  Gradual Duration: 8 years. Timing:  Intermittent Progression:  Worsening Chronicity:  Chronic Context:  Hx of seizure like episodes, ongoing for 8 years, consist of hypotonia, tremors. Has seen multiple neurologists for this. He is [facility] who wants blood drawn during an episode. This episode has been present for past 4 hours. Relieved by:  Nothing Worsened by:  Nothing Associated symptoms: no cough, no diarrhea, no fever, no shortness of breath and no vomiting    HPI Comments: Jay Krueger is a 29 y.o. male brought in by his parents, with a history of mental retardation and seizure, who presents to the Emergency Department complaining of an acute-on-chronic episode of moderately decreased muscle tone that started 3 hours ago. His father characterizes episodes as generalized tremors and loss of ability to stand or ambulate. Pt normally walks and stands without support. Pt is [facility] being [facility] who is requesting lab work during the episode. His family denies cough, diarrhea, fever, SOB and vomiting as associated symptoms.   [name]  Past Medical History  Diagnosis Date  . Mental retardation   . Seizure    Past Surgical History  Procedure Laterality Date  . Hernia repair      umbilical   No family history  on file. History  Substance Use Topics  . Smoking status: Never Smoker   . Smokeless tobacco: Not on file  . Alcohol Use: No    Review of Systems  Constitutional: Negative for fever.  Respiratory: Negative for cough and shortness of breath.   Gastrointestinal: Negative for vomiting and diarrhea.  All other systems reviewed and are negative.     Allergies  Review of patient's allergies indicates no known allergies.  Home Medications   Prior to Admission medications   Medication Sig Start Date End Date Taking? Authorizing Provider  cloNIDine (CATAPRES) 0.1 MG tablet Take 1 tablet (0.1 mg total) by mouth at bedtime. Patient not taking: Reported on 09/23/2014 12/15/13 12/15/14  Archer AsaGerald Plovsky, MD  cloNIDine (CATAPRES) 0.1 MG tablet Take 1 tablet (0.1 mg total) by mouth at bedtime. 05/11/14   Archer AsaGerald Plovsky, MD  divalproex (DEPAKOTE ER) 250 MG 24 hr tablet Take 1 tablet (250 mg total) by mouth 2 (two) times daily. 09/02/12 09/02/13  [name], PA-C  divalproex (DEPAKOTE ER) 250 MG 24 hr tablet TAKE 1 TABLET (250 MG TOTAL) BY MOUTH 2 (TWO) TIMES DAILY. 05/11/14   Archer AsaGerald Plovsky, MD  HYDROcodone-acetaminophen (NORCO/VICODIN) 5-325 MG per tablet Take 1 tablet by mouth every 4 (four) hours as needed. 09/24/14   [name], PA-C  LORazepam (ATIVAN) 1 MG tablet Take 1 tablet (1 mg total) by mouth every 6 (six) hours as needed (agitation, tremors). 07/12/14   [name], MD  omeprazole (PRILOSEC OTC) 20 MG tablet Take 20 mg by  mouth daily.    Historical Provider, MD  polyethylene glycol (MIRALAX / GLYCOLAX) packet Take 17 g by mouth at bedtime.    Historical Provider, MD   BP 139/107 mmHg  Pulse 118  Resp 24  Wt 234 lb (106.142 kg)  SpO2 98% Physical Exam  Constitutional: He appears well-developed and well-nourished. No distress.  HENT:  Head: Normocephalic and atraumatic.  Mouth/Throat: No oropharyngeal exudate.  Eyes: EOM are normal. Pupils are equal, round, and reactive to  light.  Neck: Normal range of motion. Neck supple.  Cardiovascular: Normal rate and regular rhythm.  Exam reveals no friction rub.   No murmur heard. Pulmonary/Chest: Effort normal and breath sounds normal. No respiratory distress. He has no wheezes. He has no rales.  Abdominal: He exhibits no distension. There is no tenderness. There is no rebound.  Musculoskeletal: Normal range of motion. He exhibits no edema.  Neurological: He is alert. No cranial nerve deficit. He exhibits normal muscle tone. Coordination normal.  Mild diffuse hypotonia  Skin: Skin is warm. No rash noted. He is not diaphoretic.  Nursing note and vitals reviewed.   ED Course  Procedures   DIAGNOSTIC STUDIES: Oxygen Saturation is 98% on RA, normal by my interpretation.    COORDINATION OF CARE: 7:50 PM Discussed treatment plan with pt at bedside and pt agreed to plan.   Labs Review Labs Reviewed  CBC - Abnormal; Notable for the following:    RBC 5.89 (*)    All other components within normal limits  COMPREHENSIVE METABOLIC PANEL  AMMONIA  CK  TSH  MAGNESIUM  T4, FREE  I-STAT CG4 LACTIC ACID, ED    Imaging Review No results found.   EKG Interpretation None      MDM   Final diagnoses:  Seizure    29 year old male with history of MR and seizures presents with another seizure-like episode. These are chronic and have been ongoing for 8 years. They are worsening however. He is following a neurologist at [facility]. The neurologist wants them to get labs drawn during an episode. One episode started today over the past 4 hours. Parents showed me a video on the phone, he sitting in his chair, having tremors, having some truncal and stability. This is common for him. He is lying in the bed having muscle tremors. He did reach out and shake my hand however. He's moving all extremities but has a tremor in bilateral upper extremities. We'll draw labs requested. He's had this episodes for a long time and has had  extensive workup by Neurologists, so will only obtain requested labs.  Labs are normal. No reason to consult neurology. Stable for discharge.  I personally performed the services described in this documentation, which was scribed in my presence. The recorded information has been reviewed and is accurate.     Jay Mocha, MD 02/03/15 [phone]

## 2015-02-03 LAB — T4, FREE: FREE T4: 1.17 ng/dL (ref 0.80–1.80)

## 2018-05-02 ENCOUNTER — Inpatient Hospital Stay (HOSPITAL_COMMUNITY): Payer: 59

## 2018-05-02 ENCOUNTER — Encounter (HOSPITAL_COMMUNITY): Payer: Self-pay | Admitting: Pharmacy Technician

## 2018-05-02 ENCOUNTER — Emergency Department (HOSPITAL_COMMUNITY): Payer: 59

## 2018-05-02 ENCOUNTER — Inpatient Hospital Stay (HOSPITAL_COMMUNITY)
Admission: EM | Admit: 2018-05-02 | Discharge: 2018-05-14 | DRG: 296 | Disposition: E | Payer: 59 | Attending: Pulmonary Disease | Admitting: Pulmonary Disease

## 2018-05-02 ENCOUNTER — Other Ambulatory Visit: Payer: Self-pay

## 2018-05-02 DIAGNOSIS — G936 Cerebral edema: Secondary | ICD-10-CM | POA: Diagnosis not present

## 2018-05-02 DIAGNOSIS — Z79899 Other long term (current) drug therapy: Secondary | ICD-10-CM | POA: Diagnosis not present

## 2018-05-02 DIAGNOSIS — G931 Anoxic brain damage, not elsewhere classified: Secondary | ICD-10-CM | POA: Diagnosis not present

## 2018-05-02 DIAGNOSIS — G40909 Epilepsy, unspecified, not intractable, without status epilepticus: Secondary | ICD-10-CM | POA: Diagnosis present

## 2018-05-02 DIAGNOSIS — Z66 Do not resuscitate: Secondary | ICD-10-CM | POA: Diagnosis not present

## 2018-05-02 DIAGNOSIS — Z8249 Family history of ischemic heart disease and other diseases of the circulatory system: Secondary | ICD-10-CM | POA: Diagnosis not present

## 2018-05-02 DIAGNOSIS — Z6841 Body Mass Index (BMI) 40.0 and over, adult: Secondary | ICD-10-CM

## 2018-05-02 DIAGNOSIS — Z978 Presence of other specified devices: Secondary | ICD-10-CM

## 2018-05-02 DIAGNOSIS — J9602 Acute respiratory failure with hypercapnia: Secondary | ICD-10-CM

## 2018-05-02 DIAGNOSIS — F79 Unspecified intellectual disabilities: Secondary | ICD-10-CM | POA: Diagnosis present

## 2018-05-02 DIAGNOSIS — E669 Obesity, unspecified: Secondary | ICD-10-CM | POA: Diagnosis present

## 2018-05-02 DIAGNOSIS — R402312 Coma scale, best motor response, none, at arrival to emergency department: Secondary | ICD-10-CM | POA: Diagnosis present

## 2018-05-02 DIAGNOSIS — E860 Dehydration: Secondary | ICD-10-CM | POA: Diagnosis present

## 2018-05-02 DIAGNOSIS — R402112 Coma scale, eyes open, never, at arrival to emergency department: Secondary | ICD-10-CM | POA: Diagnosis present

## 2018-05-02 DIAGNOSIS — J9600 Acute respiratory failure, unspecified whether with hypoxia or hypercapnia: Secondary | ICD-10-CM

## 2018-05-02 DIAGNOSIS — G248 Other dystonia: Secondary | ICD-10-CM | POA: Diagnosis present

## 2018-05-02 DIAGNOSIS — H5702 Anisocoria: Secondary | ICD-10-CM | POA: Diagnosis present

## 2018-05-02 DIAGNOSIS — J9601 Acute respiratory failure with hypoxia: Secondary | ICD-10-CM | POA: Diagnosis present

## 2018-05-02 DIAGNOSIS — G935 Compression of brain: Secondary | ICD-10-CM | POA: Diagnosis not present

## 2018-05-02 DIAGNOSIS — J690 Pneumonitis due to inhalation of food and vomit: Secondary | ICD-10-CM | POA: Diagnosis present

## 2018-05-02 DIAGNOSIS — G709 Myoneural disorder, unspecified: Secondary | ICD-10-CM | POA: Diagnosis present

## 2018-05-02 DIAGNOSIS — D72820 Lymphocytosis (symptomatic): Secondary | ICD-10-CM | POA: Diagnosis present

## 2018-05-02 DIAGNOSIS — I469 Cardiac arrest, cause unspecified: Secondary | ICD-10-CM | POA: Diagnosis present

## 2018-05-02 DIAGNOSIS — R402212 Coma scale, best verbal response, none, at arrival to emergency department: Secondary | ICD-10-CM | POA: Diagnosis present

## 2018-05-02 LAB — I-STAT ARTERIAL BLOOD GAS, ED
Acid-base deficit: 8 mmol/L — ABNORMAL HIGH (ref 0.0–2.0)
BICARBONATE: 19.1 mmol/L — AB (ref 20.0–28.0)
O2 Saturation: 97 %
PO2 ART: 104 mmHg (ref 83.0–108.0)
TCO2: 20 mmol/L — ABNORMAL LOW (ref 22–32)
pCO2 arterial: 43.4 mmHg (ref 32.0–48.0)
pH, Arterial: 7.252 — ABNORMAL LOW (ref 7.350–7.450)

## 2018-05-02 LAB — URINALYSIS, ROUTINE W REFLEX MICROSCOPIC
BACTERIA UA: NONE SEEN
Bilirubin Urine: NEGATIVE
Glucose, UA: 150 mg/dL — AB
Ketones, ur: 5 mg/dL — AB
Leukocytes, UA: NEGATIVE
NITRITE: NEGATIVE
Protein, ur: 100 mg/dL — AB
SPECIFIC GRAVITY, URINE: 1.012 (ref 1.005–1.030)
pH: 6 (ref 5.0–8.0)

## 2018-05-02 LAB — CBC WITH DIFFERENTIAL/PLATELET
BASOS PCT: 1 %
Basophils Absolute: 0.2 10*3/uL — ABNORMAL HIGH (ref 0.0–0.1)
EOS ABS: 0.2 10*3/uL (ref 0.0–0.7)
Eosinophils Relative: 1 %
HEMATOCRIT: 53.2 % — AB (ref 39.0–52.0)
HEMOGLOBIN: 17.1 g/dL — AB (ref 13.0–17.0)
LYMPHS PCT: 40 %
Lymphs Abs: 8.8 10*3/uL — ABNORMAL HIGH (ref 0.7–4.0)
MCH: 28.9 pg (ref 26.0–34.0)
MCHC: 32.1 g/dL (ref 30.0–36.0)
MCV: 89.9 fL (ref 78.0–100.0)
Monocytes Absolute: 0.9 10*3/uL (ref 0.1–1.0)
Monocytes Relative: 4 %
NEUTROS ABS: 11.8 10*3/uL — AB (ref 1.7–7.7)
Neutrophils Relative %: 54 %
Platelets: 206 10*3/uL (ref 150–400)
RBC: 5.92 MIL/uL — ABNORMAL HIGH (ref 4.22–5.81)
RDW: 11.9 % (ref 11.5–15.5)
WBC: 21.9 10*3/uL — ABNORMAL HIGH (ref 4.0–10.5)

## 2018-05-02 LAB — HEMOGLOBIN A1C
HEMOGLOBIN A1C: 5.3 % (ref 4.8–5.6)
MEAN PLASMA GLUCOSE: 105.41 mg/dL

## 2018-05-02 LAB — POCT I-STAT 3, ART BLOOD GAS (G3+)
ACID-BASE DEFICIT: 8 mmol/L — AB (ref 0.0–2.0)
Bicarbonate: 17.8 mmol/L — ABNORMAL LOW (ref 20.0–28.0)
O2 SAT: 100 %
TCO2: 19 mmol/L — AB (ref 22–32)
pCO2 arterial: 36.2 mmHg (ref 32.0–48.0)
pH, Arterial: 7.298 — ABNORMAL LOW (ref 7.350–7.450)
pO2, Arterial: 216 mmHg — ABNORMAL HIGH (ref 83.0–108.0)

## 2018-05-02 LAB — COMPREHENSIVE METABOLIC PANEL
ALBUMIN: 3.6 g/dL (ref 3.5–5.0)
ALK PHOS: 106 U/L (ref 38–126)
ALT: 154 U/L — ABNORMAL HIGH (ref 0–44)
AST: 147 U/L — AB (ref 15–41)
Anion gap: 16 — ABNORMAL HIGH (ref 5–15)
BUN: 14 mg/dL (ref 6–20)
CALCIUM: 8.3 mg/dL — AB (ref 8.9–10.3)
CO2: 18 mmol/L — ABNORMAL LOW (ref 22–32)
Chloride: 106 mmol/L (ref 98–111)
Creatinine, Ser: 1.53 mg/dL — ABNORMAL HIGH (ref 0.61–1.24)
GFR calc Af Amer: >60 mL/min (ref >60)
GFR, EST NON AFRICAN AMERICAN: 60 mL/min — AB (ref >60)
GLUCOSE: 240 mg/dL — AB (ref 70–99)
Potassium: 5.1 mmol/L (ref 3.5–5.1)
Sodium: 140 mmol/L (ref 135–145)
TOTAL PROTEIN: 6.6 g/dL (ref 6.5–8.1)
Total Bilirubin: 0.8 mg/dL (ref 0.3–1.2)

## 2018-05-02 LAB — PROTIME-INR
INR: 1.2
PROTHROMBIN TIME: 15.1 s (ref 11.4–15.2)

## 2018-05-02 LAB — I-STAT CG4 LACTIC ACID, ED: Lactic Acid, Venous: 7.82 mmol/L (ref 0.5–1.9)

## 2018-05-02 LAB — RAPID URINE DRUG SCREEN, HOSP PERFORMED
Amphetamines: NOT DETECTED
Barbiturates: NOT DETECTED
Benzodiazepines: POSITIVE — AB
Cocaine: NOT DETECTED
OPIATES: NOT DETECTED
TETRAHYDROCANNABINOL: NOT DETECTED

## 2018-05-02 LAB — I-STAT TROPONIN, ED: Troponin i, poc: 0.4 ng/mL (ref 0.00–0.08)

## 2018-05-02 LAB — MAGNESIUM: Magnesium: 2.6 mg/dL — ABNORMAL HIGH (ref 1.7–2.4)

## 2018-05-02 LAB — TROPONIN I: TROPONIN I: 1.58 ng/mL — AB (ref <0.03)

## 2018-05-02 LAB — APTT: APTT: 26 s (ref 24–36)

## 2018-05-02 LAB — GLUCOSE, CAPILLARY: Glucose-Capillary: 146 mg/dL — ABNORMAL HIGH (ref 70–99)

## 2018-05-02 LAB — MRSA PCR SCREENING: MRSA by PCR: NEGATIVE

## 2018-05-02 MED ORDER — ORAL CARE MOUTH RINSE
15.0000 mL | OROMUCOSAL | Status: DC
  Administered 2018-05-02 – 2018-05-07 (×41): 15 mL via OROMUCOSAL

## 2018-05-02 MED ORDER — PIPERACILLIN-TAZOBACTAM 3.375 G IVPB 30 MIN
3.3750 g | Freq: Once | INTRAVENOUS | Status: AC
  Administered 2018-05-02: 3.375 g via INTRAVENOUS
  Filled 2018-05-02: qty 50

## 2018-05-02 MED ORDER — CHLORHEXIDINE GLUCONATE 0.12% ORAL RINSE (MEDLINE KIT): 15.0000 mL | Freq: Two times a day (BID) | OROMUCOSAL | Status: DC

## 2018-05-02 MED ORDER — PIPERACILLIN-TAZOBACTAM 3.375 G IVPB
3.3750 g | Freq: Three times a day (TID) | INTRAVENOUS | Status: DC
  Administered 2018-05-03 – 2018-05-07 (×14): 3.375 g via INTRAVENOUS
  Filled 2018-05-02 (×15): qty 50

## 2018-05-02 MED ORDER — SODIUM CHLORIDE 0.9 % IV BOLUS
1000.0000 mL | Freq: Once | INTRAVENOUS | Status: AC
  Administered 2018-05-02: 1000 mL via INTRAVENOUS

## 2018-05-02 MED ORDER — FAMOTIDINE IN NACL 20-0.9 MG/50ML-% IV SOLN
20.0000 mg | Freq: Two times a day (BID) | INTRAVENOUS | Status: DC
  Filled 2018-05-02: qty 50

## 2018-05-02 MED ORDER — PROPOFOL 1000 MG/100ML IV EMUL
5.0000 ug/kg/min | Freq: Once | INTRAVENOUS | Status: AC
  Administered 2018-05-02: 10 ug/kg/min via INTRAVENOUS

## 2018-05-02 MED ORDER — MIDAZOLAM HCL 50 MG/10ML IJ SOLN
0.0000 mg/h | INTRAMUSCULAR | Status: DC
  Administered 2018-05-02 – 2018-05-03 (×5): 10 mg/h via INTRAVENOUS
  Administered 2018-05-03: 11 mg/h via INTRAVENOUS
  Administered 2018-05-03: 10 mg/h via INTRAVENOUS
  Administered 2018-05-04: 4 mg/h via INTRAVENOUS
  Filled 2018-05-02 (×2): qty 20
  Filled 2018-05-02: qty 10
  Filled 2018-05-02: qty 20
  Filled 2018-05-02: qty 10
  Filled 2018-05-02: qty 20

## 2018-05-02 MED ORDER — FENTANYL 2500MCG IN NS 250ML (10MCG/ML) PREMIX INFUSION
0.0000 ug/h | INTRAVENOUS | Status: DC
  Administered 2018-05-02: 25 ug/h via INTRAVENOUS
  Administered 2018-05-03: 300 ug/h via INTRAVENOUS
  Administered 2018-05-04: 200 ug/h via INTRAVENOUS
  Filled 2018-05-02 (×6): qty 250

## 2018-05-02 MED ORDER — VALPROATE SODIUM 500 MG/5ML IV SOLN
200.0000 mg | Freq: Three times a day (TID) | INTRAVENOUS | Status: DC
  Administered 2018-05-02 – 2018-05-07 (×14): 200 mg via INTRAVENOUS
  Filled 2018-05-02 (×16): qty 2

## 2018-05-02 MED ORDER — FAMOTIDINE IN NACL 20-0.9 MG/50ML-% IV SOLN
20.0000 mg | Freq: Two times a day (BID) | INTRAVENOUS | Status: DC
  Administered 2018-05-03 – 2018-05-07 (×10): 20 mg via INTRAVENOUS
  Filled 2018-05-02 (×9): qty 50

## 2018-05-02 MED ORDER — NOREPINEPHRINE 4 MG/250ML-% IV SOLN
0.0000 ug/min | INTRAVENOUS | Status: DC
  Filled 2018-05-02: qty 250

## 2018-05-02 MED ORDER — SODIUM CHLORIDE 0.9 % IV SOLN
INTRAVENOUS | Status: DC
  Administered 2018-05-02 – 2018-05-04 (×2): via INTRAVENOUS

## 2018-05-02 MED ORDER — VANCOMYCIN HCL 10 G IV SOLR
2000.0000 mg | Freq: Once | INTRAVENOUS | Status: AC
  Administered 2018-05-02: 2000 mg via INTRAVENOUS
  Filled 2018-05-02: qty 2000

## 2018-05-02 MED ORDER — HEPARIN SODIUM (PORCINE) 5000 UNIT/ML IJ SOLN
5000.0000 [IU] | Freq: Three times a day (TID) | INTRAMUSCULAR | Status: DC
  Administered 2018-05-02 – 2018-05-07 (×13): 5000 [IU] via SUBCUTANEOUS
  Filled 2018-05-02 (×14): qty 1

## 2018-05-02 MED ORDER — CHLORHEXIDINE GLUCONATE 0.12% ORAL RINSE (MEDLINE KIT)
15.0000 mL | Freq: Two times a day (BID) | OROMUCOSAL | Status: DC
  Administered 2018-05-02 – 2018-05-07 (×9): 15 mL via OROMUCOSAL

## 2018-05-02 MED ORDER — PROPOFOL 1000 MG/100ML IV EMUL
INTRAVENOUS | Status: AC
  Filled 2018-05-02: qty 100

## 2018-05-02 MED ORDER — LACTATED RINGERS IV BOLUS: 1000.0000 mL | Freq: Once | INTRAVENOUS | Status: DC

## 2018-05-02 MED ORDER — ORAL CARE MOUTH RINSE: 15.0000 mL | OROMUCOSAL | Status: DC

## 2018-05-02 MED ORDER — INSULIN ASPART 100 UNIT/ML ~~LOC~~ SOLN
0.0000 [IU] | Freq: Three times a day (TID) | SUBCUTANEOUS | Status: DC
  Administered 2018-05-03: 2 [IU] via SUBCUTANEOUS
  Administered 2018-05-04 – 2018-05-06 (×5): 1 [IU] via SUBCUTANEOUS

## 2018-05-02 NOTE — Procedures (Signed)
Arterial Catheter Insertion Procedure Note Jay Krueger [id number] 06/03/1986  Procedure: Insertion of Arterial Catheter  Indications: Blood pressure monitoring and Frequent blood sampling  Procedure Details Consent: Unable to obtain consent because of altered level of consciousness. Time Out: Verified patient identification, verified procedure, site/side was marked, verified correct patient position, special equipment/implants available, medications/allergies/relevent history reviewed, required imaging and test results available.  Performed  Maximum sterile technique was used including antiseptics, cap, gloves, gown, hand hygiene, mask and sheet. Skin prep: Chlorhexidine; local anesthetic administered 20 gauge catheter was inserted into right radial artery using the Seldinger technique. ULTRASOUND GUIDANCE USED: NO Evaluation Blood flow good; BP tracing good. Complications: No apparent complications.   Jay Krueger, Jay Krueger 05/09/2018

## 2018-05-02 NOTE — Progress Notes (Signed)
Pt transported from ED to CT1 then to 2H05 without incidence

## 2018-05-02 NOTE — ED Notes (Signed)
Dr. Wallace CullensGray in to examine pt.

## 2018-05-02 NOTE — ED Notes (Signed)
Airway 7.0 and 26@ the lip

## 2018-05-02 NOTE — ED Triage Notes (Signed)
Pt BIB ems from home post CPR. Pt caregiver states pt was eating gummy bears and she went to the restroom. When she came back pt was cyanotic with arms extended. Pt apneic and pulseless when fire arrived. King airway place by fire. CPR given intermittently between 1545 and 1624. Down approx 10 mins before fire arrived. Given 4 amps of epi and started on Epi drip by ems. 1200cc NS given en route via IO L leg.

## 2018-05-02 NOTE — ED Provider Notes (Signed)
[facility] EMERGENCY DEPARTMENT Provider Note   CSN: [id number] Arrival date & time: May 21, 2018  1712  History   Chief Complaint Chief Complaint  Patient presents with  . Cardiac Arrest   HPI  Patient is a 32 year old male with history of mental delay and seizure disorder presenting to the ED via EMS for cardiac arrest. Per EMS, he was at home with a caregiver today and was eating gummy candy. He was then found with stiff extremities, eyes rolled back, and foaming from the mouth. Bystander CPR was initiated and was continued on EMS arrival. He was intubated at scene without paralytic or induction agent. Initial rhythm was reportedly asystole followed by PEA and followed by ROSC with sinus rhythm. He was started on an epinephrine infusion but quickly became hypertensive and it was therefore discontinued.  Past Medical History:  Diagnosis Date  . Mental retardation   . Seizure [facility])     Patient Active Problem List   Diagnosis Date Noted  . Conduct disorder 09/15/2013  . 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 HERNIA REPAIR     umbilical        Home Medications    Prior to Admission medications   Medication Sig Start Date End Date Taking? Authorizing Provider  ALPRAZolam Prudy Feeler) 0.5 MG tablet Take 0.5 mg by mouth 3 (three) times daily.   Yes [provider]  cloNIDine (CATAPRES) 0.1 MG tablet Take 1 tablet (0.1 mg total) by mouth at bedtime. 05/11/14  Yes Plovsky, Earvin Hansen, MD  divalproex (DEPAKOTE ER) 500 MG 24 hr tablet Take 500 mg by mouth 2 (two) times daily.   Yes [provider]  OMEPRAZOLE MAGNESIUM PO Take 40 mg by mouth every morning.    Yes [provider]  OVER THE COUNTER MEDICATION Take by mouth at bedtime. CBD OIL   Yes [provider]  polyethylene glycol  (MIRALAX / GLYCOLAX) packet Take 17 g by mouth 2 (two) times daily as needed for mild constipation.    Yes [provider]  divalproex (DEPAKOTE ER) 250 MG 24 hr tablet TAKE 1 TABLET (250 MG TOTAL) BY MOUTH 2 (TWO) TIMES DAILY. Patient not taking: Reported on 2018/05/21 05/11/14   Archer Asa, MD    Family History No family history on file.  Social History Social History   Tobacco Use  . Smoking status: Never Smoker  Substance Use Topics  . Alcohol use: No  . Drug use: No     Allergies   Patient has no known allergies.   Review of Systems Review of Systems  Unable to perform ROS: Intubated     Physical Exam Updated Vital Signs BP (!) 209/125   Pulse (!) 124   Temp 98.8 F (37.1 C) (Oral)   Resp (!) 22   Ht 5\' 10"  (1.778 m)   Wt 127 kg (280 lb)   SpO2 99%   BMI 40.18 kg/m   Physical Exam  Constitutional: He is oriented to person, place, and time. No distress.  HENT:  Head: Normocephalic.  Mouth/Throat: Oropharynx is clear and moist.  Abrasion over the scalp. ETT in place.   Eyes: Pupils are equal, round, and reactive to light.  Neck: Neck supple. No JVD present.  Cardiovascular: Regular rhythm, normal heart sounds and intact distal pulses. Tachycardia present.  No murmur heard. Pulmonary/Chest: Breath sounds normal. No  respiratory distress. He has no wheezes. He has no rales.  Abdominal: Soft. He exhibits no distension.  Obese  Musculoskeletal: He exhibits no deformity.  Neurological: He is alert and oriented to person, place, and time.  GCS 3. Decreased muscle tone throughout.   Skin: Skin is warm and dry.  Nursing note and vitals reviewed.    ED Treatments / Results  Labs (all labs ordered are listed, but only abnormal results are displayed) Labs Reviewed  CBC WITH DIFFERENTIAL/PLATELET - Abnormal; Notable for the following components:      Result Value   WBC 21.9 (*)    RBC 5.92 (*)    Hemoglobin 17.1 (*)    HCT 53.2 (*)    Neutro  Abs 11.8 (*)    Lymphs Abs 8.8 (*)    Basophils Absolute 0.2 (*)    All other components within normal limits  COMPREHENSIVE METABOLIC PANEL - Abnormal; Notable for the following components:   CO2 18 (*)    Glucose, Bld 240 (*)    Creatinine, Ser 1.53 (*)    Calcium 8.3 (*)    AST 147 (*)    ALT 154 (*)    GFR calc non Af Amer 60 (*)    Anion gap 16 (*)    All other components within normal limits  MAGNESIUM - Abnormal; Notable for the following components:   Magnesium 2.6 (*)    All other components within normal limits  URINALYSIS, ROUTINE W REFLEX MICROSCOPIC - Abnormal; Notable for the following components:   APPearance CLOUDY (*)    Glucose, UA 150 (*)    Hgb urine dipstick MODERATE (*)    Ketones, ur 5 (*)    Protein, ur 100 (*)    All other components within normal limits  RAPID URINE DRUG SCREEN, HOSP PERFORMED - Abnormal; Notable for the following components:   Benzodiazepines POSITIVE (*)    All other components within normal limits  I-STAT CG4 LACTIC ACID, ED - Abnormal; Notable for the following components:   Lactic Acid, Venous 7.82 (*)    All other components within normal limits  I-STAT ARTERIAL BLOOD GAS, ED - Abnormal; Notable for the following components:   pH, Arterial 7.252 (*)    Bicarbonate 19.1 (*)    TCO2 20 (*)    Acid-base deficit 8.0 (*)    All other components within normal limits  I-STAT TROPONIN, ED - Abnormal; Notable for the following components:   Troponin i, poc 0.40 (*)    All other components within normal limits  CULTURE, BLOOD (ROUTINE X 2)  CULTURE, BLOOD (ROUTINE X 2)  URINE CULTURE  PROTIME-INR    EKG None  Radiology Dg Chest Portable 1 View  Result Date: 2018-05-22 CLINICAL DATA:  Intubated. EXAM: PORTABLE CHEST 1 VIEW COMPARISON:  09/23/2014. FINDINGS: Endotracheal tube in satisfactory position. Poor inspiration, with improvement. Mild patchy opacity in the right mid and upper lung zones. Clear left lung. Normal appearing  bones. IMPRESSION: Mild patchy opacity in the right mid and upper lung zones. This could represent pneumonia or focal pulmonary edema/contusion following CPR. Electronically Signed   By: Beckie Salts M.D.   On: 2018/05/22 17:45    Procedures Procedures (including critical care time)  Medications Ordered in ED Medications  propofol (DIPRIVAN) 1000 MG/100ML infusion (has no administration in time range)  vancomycin (VANCOCIN) 2,000 mg in sodium chloride 0.9 % 500 mL IVPB (has no administration in time range)  lactated ringers bolus 1,000 mL (has no administration  in time range)  sodium chloride 0.9 % bolus 1,000 mL (has no administration in time range)  propofol (DIPRIVAN) 1000 MG/100ML infusion (20 mcg/kg/min  127 kg Intravenous Rate/Dose Change 03-29-2018 1745)  piperacillin-tazobactam (ZOSYN) IVPB 3.375 g (3.375 g Intravenous New Bag/Given 03-29-2018 1758)     Initial Impression / Assessment and Plan / ED Course  I have reviewed the triage vital signs and the nursing notes.  Pertinent labs & imaging results that were available during my care of the patient were reviewed by me and considered in my medical decision making (see chart for details).  Patient arriving via EMS s/p cardiac arrest as above. On arrival, primary survey carried out. ETT functioning well. Hypertensive and tachycardic off epinephrine. Unclear etiology of arrest but suspect seizure versus aspiration. EKG shows no STEMI. Troponin mildly elevated as expected post-arrest. CXR does show hazy right-sided infiltrates. ABG shows metabolic acidosis with wide A-A gradient. Patient given IV fluids and broad-spectrum antibiotics. Patient showed some ventilator dyssynchrony for which propofol infusion was started. Considered additional AED load but will defer to inpatient team pending their diagnostic planning.  On-call intensivist Dr. Wallace CullensGray promptly consulted. He evaluated patient in ED. CT head was obtained en route to MICU for further  definitive care.  Final Clinical Impressions(s) / ED Diagnoses   Final diagnoses:  Cardiac arrest [facility](HCC)      Cecille PoMacklin, Yeslin Delio W, MD 05/03/18 0310    Maia PlanLong, Joshua G, MD 05/03/18 1002

## 2018-05-02 NOTE — H&P (Signed)
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  The disorder is manifested as involuntary limb movements and formations which can last anywhere from 30 minutes to 10 hours and during which his mental status does not significantly change.  He was in his usual state of health this morning with no indications of a superimposed acute illness, no chest pains no fevers no chills no sweats no coughs.  He was eating some gummy bears when last seen by his caretaker.  When she returned to the room he was unresponsive and she felt he was pulseless.  EMS was called and he was treated for PEA followed by asystole with 45 minutes of ACLS. ROSC was achieved, and he is now in the emergency room on no pressors intubated and mechanically ventilated.  He is sedated on 10 mcg of propofol at the time of my examination.  PAST MEDICAL HISTORY :  He  has a past medical history of Mental retardation and Seizure (HCC).  PAST SURGICAL HISTORY: He  has a past surgical history that includes Hernia repair.  No Known Allergies  No current facility-administered medications on file prior to encounter.    Current Outpatient Medications on File Prior to Encounter  Medication Sig  . ALPRAZolam (XANAX) 0.5 MG tablet Take 0.5 mg by mouth 3 (three) times daily.  . cloNIDine (CATAPRES) 0.1 MG tablet Take 1 tablet (0.1 mg total) by mouth at bedtime.  . divalproex (DEPAKOTE ER) 500 MG 24 hr tablet Take 500 mg by mouth 2 (two) times daily.  Marland Kitchen. OMEPRAZOLE MAGNESIUM PO Take 40 mg by mouth every morning.   Marland Kitchen. OVER THE COUNTER MEDICATION Take by mouth at bedtime. CBD OIL  . polyethylene glycol (MIRALAX / GLYCOLAX) packet Take 17 g by mouth 2 (two)  times daily as needed for mild constipation.   . divalproex (DEPAKOTE ER) 250 MG 24 hr tablet TAKE 1 TABLET (250 MG TOTAL) BY MOUTH 2 (TWO) TIMES DAILY. (Patient not taking: Reported on 04/16/2018)    FAMILY HISTORY:  His family history is not on file.  Family reports that his siblings are normal without any dyskinetic movements.  SOCIAL HISTORY: He  reports that he has never smoked. He does not have any smokeless tobacco history on file. He reports that he does not drink alcohol or use drugs.  REVIEW OF SYSTEMS:   At baseline he is ambulatory and able to walk ad lib.  He is nonverbal.  There have been a very few occasions where his parents feel that he may have had episodes that were different from his usual episodes of involuntary movement which included loss of consciousness and collapsing.  These have been very rare.  He is not known ever to have had difficulties with aspiration during his episodes and is never previously been treated for pneumonia.  He has no known history of chest pain, arrhythmias, or heart murmurs.  There is no history of endocrine disorders specifically no thyroid disease or diabetes.  The remainder of the review of systems is not remarkable   SUBJECTIVE:  As above  VITAL SIGNS: BP (!) 146/105   Pulse (!) 112   Temp 97.9 F (36.6 C)   Resp (!)  24   Ht 5\' 10"  (1.778 m)   Wt 280 lb (127 kg)   SpO2 97%   BMI 40.18 kg/m   HEMODYNAMICS:    VENTILATOR SETTINGS: Vent Mode: PRVC FiO2 (%):  [90 %-100 %] 90 % Set Rate:  [14 bmp-22 bmp] 22 bmp Vt Set:  [660 mL-720 mL] 720 mL PEEP:  [5 cmH20] 5 cmH20  INTAKE / OUTPUT: I/O last 3 completed shifts: In: -  Out: 100 [Urine:100]  PHYSICAL EXAMINATION: General: This is a somewhat obese young male who is orally intubated and mechanically ventilated. Neuro: He is intermittently having episodes where his eyes pop open and he has fasciculations of the mouth.  He is not responsive to voice and not reacting to noxious  stimuli.  Pupils are equal. Cardiovascular: S1 and S2 are regular without murmur rub or gallop.  He does have symmetric 1/2+ lower extremity edema Lungs: Respirations are unlabored, there is symmetric air movement no wheezes he has scattered rhonchi more prominent on the right Abdomen: The abdomen is obese and soft without any organomegaly masses or tenderness, he is anicteric. Musculoskeletal: Limbs are pink and warm   LABS:  BMET Recent Labs  Lab 04/25/2018 1717  NA 140  K 5.1  CL 106  CO2 18*  BUN 14  CREATININE 1.53*  GLUCOSE 240*    Electrolytes Recent Labs  Lab 05/05/2018 1717  CALCIUM 8.3*  MG 2.6*    CBC Recent Labs  Lab 04/18/2018 1717  WBC 21.9*  HGB 17.1*  HCT 53.2*  PLT 206    Coag's Recent Labs  Lab 04/29/2018 1717  INR 1.20    Sepsis Markers Recent Labs  Lab 05/06/2018 1730  LATICACIDVEN 7.82*    ABG Recent Labs  Lab 04/24/2018 1820  PHART 7.252*  PCO2ART 43.4  PO2ART 104.0    Liver Enzymes Recent Labs  Lab 04/15/2018 1717  AST 147*  ALT 154*  ALKPHOS 106  BILITOT 0.8  ALBUMIN 3.6    Cardiac Enzymes No results for input(s): TROPONINI, PROBNP in the last 168 hours.  Glucose No results for input(s): GLUCAP in the last 168 hours.  Imaging Dg Chest Portable 1 View  Result Date: 04/16/2018 CLINICAL DATA:  Intubated. EXAM: PORTABLE CHEST 1 VIEW COMPARISON:  09/23/2014. FINDINGS: Endotracheal tube in satisfactory position. Poor inspiration, with improvement. Mild patchy opacity in the right mid and upper lung zones. Clear left lung. Normal appearing bones. IMPRESSION: Mild patchy opacity in the right mid and upper lung zones. This could represent pneumonia or focal pulmonary edema/contusion following CPR. Electronically Signed   By: Beckie Salts M.D.   On: 05/12/2018 17:45     STUDIES:  Chest x-ray shows a well-placed endotracheal tube and central line without pneumothorax.  He has a right upper lobe infiltrate.  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  ASSESSMENT / PLAN:  PULMONARY A: Chest x-ray shows an overt infiltrate and a wide AA gradient is present.  I am treating this is aspiration with Zosyn.  I suspect that this was a provocation for his arrest.  CARDIOVASCULAR A: That is post cardiopulmonary arrest.  I will be cooling to 36 degrees.  The provocation for the rest is likely aspiration however head CT is pending as are serial enzymes.  I have ordered a mixed venous saturation to determine global cardiac performance is adequate.  Is been no change in his usual activity and suspicion of PE is low   RENAL A: Creatinine is slightly higher than baseline, will be avoiding secondary renal insults    GASTROINTESTINAL A: Prophylaxis is with Pepcid   HEMATOLOGIC A: VT prophylaxis is with subcu heparin.  I noticed that he has a substantial lymphocytosis.  This will need to be investigated if it persists.  INFECTIOUS A: Suspect aspiration.  Blood and sputum have been cultured.  Zosyn initiated.   ENDOCRINE A: He is glucose intolerant, this is not baseline for him.  Sliding scale insulin has been ordered   P:    NEUROLOGIC A: Status post cardiopulmonary arrest.  Inducing hypothermia.  I cannot comment on the significance of his involuntary movements at this time as he has involuntary movements at baseline.  In the event that this represents seizure activity I will be sedating him with high-dose benzodiazepine and continuing him on a therapeutic dose of valproic acid pending EEG.  FAMILY  -I have discussed the situation and plan of care with family    Greater than 35 minutes was spent in the care of this patient today not counting time spent performing procedures  Penny Pia, MD  [facility] Pager: [phone]  05-06-18, 7:20 PM

## 2018-05-03 ENCOUNTER — Inpatient Hospital Stay (HOSPITAL_COMMUNITY): Payer: 59

## 2018-05-03 ENCOUNTER — Encounter (HOSPITAL_COMMUNITY): Payer: Self-pay | Admitting: Emergency Medicine

## 2018-05-03 ENCOUNTER — Other Ambulatory Visit: Payer: Self-pay

## 2018-05-03 DIAGNOSIS — J9601 Acute respiratory failure with hypoxia: Secondary | ICD-10-CM

## 2018-05-03 DIAGNOSIS — I469 Cardiac arrest, cause unspecified: Secondary | ICD-10-CM

## 2018-05-03 DIAGNOSIS — J690 Pneumonitis due to inhalation of food and vomit: Secondary | ICD-10-CM | POA: Diagnosis present

## 2018-05-03 DIAGNOSIS — J9600 Acute respiratory failure, unspecified whether with hypoxia or hypercapnia: Secondary | ICD-10-CM

## 2018-05-03 LAB — URINALYSIS, ROUTINE W REFLEX MICROSCOPIC
Glucose, UA: NEGATIVE mg/dL
HGB URINE DIPSTICK: NEGATIVE
KETONES UR: 40 mg/dL — AB
Leukocytes, UA: NEGATIVE
NITRITE: NEGATIVE
pH: 5.5 (ref 5.0–8.0)

## 2018-05-03 LAB — BASIC METABOLIC PANEL
ANION GAP: 17 — AB (ref 5–15)
ANION GAP: 12 (ref 5–15)
BUN: 14 mg/dL (ref 6–20)
BUN: 15 mg/dL (ref 6–20)
CALCIUM: 7.8 mg/dL — AB (ref 8.9–10.3)
CHLORIDE: 108 mmol/L (ref 98–111)
CO2: 16 mmol/L — ABNORMAL LOW (ref 22–32)
CO2: 20 mmol/L — AB (ref 22–32)
Calcium: 7.4 mg/dL — ABNORMAL LOW (ref 8.9–10.3)
Chloride: 108 mmol/L (ref 98–111)
Creatinine, Ser: 0.96 mg/dL (ref 0.61–1.24)
Creatinine, Ser: 0.78 mg/dL (ref 0.61–1.24)
GFR calc Af Amer: >60 mL/min (ref >60)
GFR calc Af Amer: >60 mL/min (ref >60)
GFR calc non Af Amer: >60 mL/min (ref >60)
Glucose, Bld: 162 mg/dL — ABNORMAL HIGH (ref 70–99)
Glucose, Bld: 113 mg/dL — ABNORMAL HIGH (ref 70–99)
POTASSIUM: 3.7 mmol/L (ref 3.5–5.1)
POTASSIUM: 3.1 mmol/L — AB (ref 3.5–5.1)
SODIUM: 141 mmol/L (ref 135–145)
Sodium: 140 mmol/L (ref 135–145)

## 2018-05-03 LAB — PROCALCITONIN: PROCALCITONIN: 4.95 ng/mL

## 2018-05-03 LAB — POCT I-STAT 3, ART BLOOD GAS (G3+)
Acid-base deficit: 10 mmol/L — ABNORMAL HIGH (ref 0.0–2.0)
Acid-base deficit: 6 mmol/L — ABNORMAL HIGH (ref 0.0–2.0)
Acid-base deficit: 8 mmol/L — ABNORMAL HIGH (ref 0.0–2.0)
BICARBONATE: 16.9 mmol/L — AB (ref 20.0–28.0)
BICARBONATE: 17.8 mmol/L — AB (ref 20.0–28.0)
Bicarbonate: 18.4 mmol/L — ABNORMAL LOW (ref 20.0–28.0)
O2 Saturation: 98 %
O2 Saturation: 98 %
O2 Saturation: 99 %
PCO2 ART: 34.2 mmHg (ref 32.0–48.0)
PCO2 ART: 39 mmHg (ref 32.0–48.0)
PH ART: 7.241 — AB (ref 7.350–7.450)
PH ART: 7.317 — AB (ref 7.350–7.450)
PH ART: 7.389 (ref 7.350–7.450)
PO2 ART: 167 mmHg — AB (ref 83.0–108.0)
PO2 ART: 99 mmHg (ref 83.0–108.0)
Patient temperature: 35.6
Patient temperature: 96.8
TCO2: 18 mmol/L — ABNORMAL LOW (ref 22–32)
TCO2: 19 mmol/L — AB (ref 22–32)
TCO2: 19 mmol/L — ABNORMAL LOW (ref 22–32)
pCO2 arterial: 30.2 mmHg — ABNORMAL LOW (ref 32.0–48.0)
pO2, Arterial: 101 mmHg (ref 83.0–108.0)

## 2018-05-03 LAB — URINE CULTURE: Culture: <10000 — AB

## 2018-05-03 LAB — URINALYSIS, MICROSCOPIC (REFLEX)
RBC / HPF: NONE SEEN RBC/hpf (ref 0–5)
SQUAMOUS EPITHELIAL / LPF: NONE SEEN (ref 0–5)

## 2018-05-03 LAB — TROPONIN I
Troponin I: 1.8 ng/mL (ref <0.03)
Troponin I: 1.73 ng/mL (ref <0.03)
Troponin I: 1.16 ng/mL (ref <0.03)

## 2018-05-03 LAB — GLUCOSE, CAPILLARY
GLUCOSE-CAPILLARY: 104 mg/dL — AB (ref 70–99)
GLUCOSE-CAPILLARY: 142 mg/dL — AB (ref 70–99)
GLUCOSE-CAPILLARY: 153 mg/dL — AB (ref 70–99)
GLUCOSE-CAPILLARY: 171 mg/dL — AB (ref 70–99)
Glucose-Capillary: 114 mg/dL — ABNORMAL HIGH (ref 70–99)
Glucose-Capillary: 116 mg/dL — ABNORMAL HIGH (ref 70–99)

## 2018-05-03 LAB — HIV ANTIBODY (ROUTINE TESTING W REFLEX): HIV Screen 4th Generation wRfx: NONREACTIVE

## 2018-05-03 LAB — PROTIME-INR
INR: 1.1
PROTHROMBIN TIME: 14.1 s (ref 11.4–15.2)

## 2018-05-03 LAB — ECHOCARDIOGRAM COMPLETE
Height: 70 in
WEIGHTICAEL: 4448 [oz_av]

## 2018-05-03 LAB — LACTIC ACID, PLASMA: LACTIC ACID, VENOUS: 5.7 mmol/L — AB (ref 0.5–1.9)

## 2018-05-03 LAB — VALPROIC ACID LEVEL: VALPROIC ACID LVL: 28 ug/mL — AB (ref 50.0–100.0)

## 2018-05-03 MED ORDER — SODIUM CHLORIDE 0.9% FLUSH: 10.0000 mL | INTRAVENOUS | Status: DC | PRN

## 2018-05-03 MED ORDER — ALTEPLASE 2 MG IJ SOLR
2.0000 mg | Freq: Once | INTRAMUSCULAR | Status: AC
  Administered 2018-05-03: 2 mg
  Filled 2018-05-03: qty 2

## 2018-05-03 MED ORDER — CHLORHEXIDINE GLUCONATE CLOTH 2 % EX PADS
6.0000 | MEDICATED_PAD | Freq: Every day | CUTANEOUS | Status: DC
  Administered 2018-05-03 – 2018-05-07 (×4): 6 via TOPICAL

## 2018-05-03 MED ORDER — STERILE WATER FOR INJECTION IV SOLN
INTRAVENOUS | Status: AC
  Administered 2018-05-03 (×2): via INTRAVENOUS
  Filled 2018-05-03 (×5): qty 850

## 2018-05-03 MED ORDER — VITAL HIGH PROTEIN PO LIQD
1000.0000 mL | ORAL | Status: DC
  Administered 2018-05-03: 1000 mL

## 2018-05-03 NOTE — Progress Notes (Signed)
Critical care progress note  Reason for admission: Status post cardiac arrest.  History of present illness:  32-year-old man with ill-defined neuromuscular disorder terrorized by periodic abnormal movements but with preserved mentation.  Unwitnessed cardiac arrest. Patient was eating candy at the time.  PEA was presenting rhythm.  Patient received 45 minutes of CPR prior to return of spontaneous circulation.  Admitted to the ICU 04/27/2018.  Initiate on TTM for target temperature 36C.  Scheduled Meds: . chlorhexidine gluconate (MEDLINE KIT)  15 mL Mouth Rinse BID  . Chlorhexidine Gluconate Cloth  6 each Topical Daily  . feeding supplement (VITAL HIGH PROTEIN)  1,000 mL Per Tube Q24H  . heparin  5,000 Units Subcutaneous Q8H  . insulin aspart  0-9 Units Subcutaneous TID WC  . mouth rinse  15 mL Mouth Rinse 10 times per day   Continuous Infusions: . sodium chloride Stopped (04/20/2018 2232)  . famotidine (PEPCID) IV Stopped (05/03/18 0922)  . fentaNYL infusion INTRAVENOUS 200 mcg/hr (05/03/18 1500)  . lactated ringers    . midazolam (VERSED) infusion 11 mg/hr (05/03/18 1607)  . norepinephrine (LEVOPHED) Adult infusion    . piperacillin-tazobactam (ZOSYN)  IV Stopped (05/03/18 1348)  .  sodium bicarbonate (isotonic) infusion in sterile water 75 mL/hr at 05/03/18 1500  . valproate sodium 200 mg (05/03/18 1500)   PRN Meds:sodium chloride flush  Vital signs in last 24 hours: Temp:  [95.2 F (35.1 C)-98.8 F (37.1 C)] 97.9 F (36.6 C) (07/21 1600) Pulse Rate:  [87-134] 98 (07/21 1600) Resp:  [14-31] 24 (07/21 1600) BP: (101-218)/(60-156) 108/77 (07/21 1600) SpO2:  [96 %-100 %] 100 % (07/21 1600) Arterial Line BP: (77-173)/(50-106) 108/79 (07/21 1600) FiO2 (%):  [40 %-100 %] 40 % (07/21 1531) Weight:  [278 lb (126.1 kg)-280 lb (127 kg)] 278 lb (126.1 kg) (07/20 2000)  Intake/Output last 3 shifts: I/O last 3 completed shifts: In: 1998.4 [I.V.:595.9; IV Piggyback:1402.5] Out: 1910  [Urine:1910] Intake/Output this shift: Total I/O In: 902.9 [I.V.:824.2; IV Piggyback:78.7] Out: 540 [Urine:535; Emesis/NG output:5]  Problem Assessment/Plan Cardiovascular and Mediastinum Cardiac arrest (HCC) Assessment & Plan Status post PEA cardiac arrest, now receiving TTM.  On Arctic Sun device targeting temperature of 36.5 C.  Temperature achieved at 2100 hrs 7/20  On examination:  Arctic Sun water temperature variable and currently low at 5.4C indicating patient heat degeneration.  Patient currently sedated on Versed and fentanyl infusions.  GCS of 3.  Pupils 2 mm and sluggish.  No response to verbal stimuli or limb stimulation.   Currently on no vasopressors with MAP of 74.  Cold and pale skin.  No visible shivering but mild piloerection.  First and second heart sounds unremarkable.  Peripheral pulses are palpable.   Assessment and plan: Status post cardiac arrest with with return of spontaneous circulation without neurological function.  On targeted temperature management to optimize neurological recovery.  Continue TTM as per protocol.  Rewarming to occur at 2100 hrs. tonight.  Maintain normothermia 37 C for 48 to 72 hours thereafter. Currently no clinical neurological activity on examination but decreasing water temperature indicates heat generation.  Increase sedation.  EEG to rule out subclinical seizure activity, which may have contributed to cardiac arrest in the first place.  Continue valproic acid.  Cardiac arrest appears to have been a primary respiratory event.  No evidence of hemodynamic instability.  Echocardiogram pending.  Respiratory Aspiration pneumonia (HCC) Assessment & Plan Patient was eating at the time of arrest.  CBC    Component Value Date/Time     WBC 21.9 (H) 04/23/2018 1717   RBC 5.92 (H) 05/01/2018 1717   HGB 17.1 (H) 05/07/2018 1717   HCT 53.2 (H) 04/14/2018 1717   PLT 206 04/21/2018 1717   MCV 89.9 05/04/2018 1717   MCH 28.9 05/11/2018 1717    MCHC 32.1 05/05/2018 1717   RDW 11.9 04/16/2018 1717   LYMPHSABS 8.8 (H) 05/13/2018 1717   MONOABS 0.9 05/07/2018 1717   EOSABS 0.2 05/07/2018 1717   BASOSABS 0.2 (H) 05/09/2018 1717   CXR: Streaky opacification of the right upper lobe consistent with aspiration (chest x-ray personally reviewed)  Assessment and plan: Aspiration pneumonia.  Upper lobe predominance suggests aspiration occurred following cardiac arrest.  We will complete a 7-day course of Zosyn. Initiate bed chest physiotherapy to mobilize secretions as patient is deeply sedated and has no spontaneous cough.  Acute respiratory failure (HCC) Assessment & Plan Acute hypoxic respiratory failure secondary to inability to protect airway.  On examination: Patient on PRVC ventilation with no ventilator asynchrony.  Acceptable airway pressures.  Normal oxygen requirements.  Chest is clear to auscultation bilaterally.  ABG    Component Value Date/Time   PHART 7.389 05/03/2018 1000   PCO2ART 30.2 (L) 05/03/2018 1000   PO2ART 101.0 05/03/2018 1000   HCO3 18.4 (L) 05/03/2018 1000   TCO2 19 (L) 05/03/2018 1000   ACIDBASEDEF 6.0 (H) 05/03/2018 1000   O2SAT 98.0 05/03/2018 1000    Assessment and plan: Acute hypoxic respiratory failure.  Continue full ventilatory support.  Hold on trials of spontaneous breathing until neurological status improves.    CRITICAL CARE Performed by: Ravi Agarwala   Total critical care time: 45 minutes  Critical care time was exclusive of separately billable procedures and treating other patients.  Critical care was necessary to treat or prevent imminent or life-threatening deterioration.  Critical care was time spent personally by me on the following activities: development of treatment plan with patient and/or surrogate as well as nursing, discussions with consultants, evaluation of patient's response to treatment, examination of patient, obtaining history from patient or surrogate, ordering and  performing treatments and interventions, ordering and review of laboratory studies, ordering and review of radiographic studies, pulse oximetry and re-evaluation of patient's condition.  Ravi Agarwala, MD [facility] ICU Physician [facility]  Pager: [phone] Mobile: [phone] After hours: [phone].  

## 2018-05-03 NOTE — Assessment & Plan Note (Addendum)
Status post PEA cardiac arrest, now receiving TTM.  On [employer] device targeting temperature of 36.5 C.  Temperature achieved at 2100 hrs 7/20  On examination:  [employer] water temperature variable and currently low at 5.4C indicating patient heat degeneration.  Patient currently sedated on Versed and fentanyl infusions.  GCS of 3.  Pupils 2 mm and sluggish.  No response to verbal stimuli or limb stimulation.   Currently on no vasopressors with MAP of 74.  Cold and pale skin.  No visible shivering but mild piloerection.  First and second heart sounds unremarkable.  Peripheral pulses are palpable.   Assessment and plan: Status post cardiac arrest with with return of spontaneous circulation without neurological function.  On targeted temperature management to optimize neurological recovery.  Continue TTM as per protocol.  Rewarming to occur at 2100 hrs. tonight.  Maintain normothermia 37 C for 48 to 72 hours thereafter. Currently no clinical neurological activity on examination but decreasing water temperature indicates heat generation.  Increase sedation.  EEG to rule out subclinical seizure activity, which may have contributed to cardiac arrest in the first place.  Continue valproic acid.  Cardiac arrest appears to have been a primary respiratory event.  No evidence of hemodynamic instability.  Echocardiogram pending.

## 2018-05-03 NOTE — Assessment & Plan Note (Addendum)
Acute hypoxic respiratory failure secondary to inability to protect airway.  On examination: Patient on PRVC ventilation with no ventilator asynchrony.  Acceptable airway pressures.  Normal oxygen requirements.  Chest is clear to auscultation bilaterally.  ABG    Component Value Date/Time   PHART 7.389 05/03/2018 1000   PCO2ART 30.2 (L) 05/03/2018 1000   PO2ART 101.0 05/03/2018 1000   HCO3 18.4 (L) 05/03/2018 1000   TCO2 19 (L) 05/03/2018 1000   ACIDBASEDEF 6.0 (H) 05/03/2018 1000   O2SAT 98.0 05/03/2018 1000    Assessment and plan: Acute hypoxic respiratory failure.  Continue full ventilatory support.  Hold on trials of spontaneous breathing until neurological status improves.

## 2018-05-03 NOTE — Progress Notes (Signed)
  Echocardiogram 2D Echocardiogram has been performed.  Neeti Knudtson T Kimiyah Blick 05/03/2018, 2:52 PM

## 2018-05-03 NOTE — Plan of Care (Signed)
  Problem: Education: Goal: Knowledge of General Education information will improve Description Including pain rating scale, medication(s)/side effects and non-pharmacologic comfort measures 05/03/2018 0524 by Chauncy PassyMeza, Joyice Magda E, RN Outcome: Progressing 05/03/2018 0157 by Chauncy PassyMeza, Yarima Penman E, RN Outcome: Progressing   Problem: Health Behavior/Discharge Planning: Goal: Ability to manage health-related needs will improve 05/03/2018 0524 by Chauncy PassyMeza, Navy Rothschild E, RN Outcome: Progressing 05/03/2018 0157 by Chauncy PassyMeza, Kasyn Rolph E, RN Outcome: Progressing   Problem: Clinical Measurements: Goal: Ability to maintain clinical measurements within normal limits will improve 05/03/2018 0524 by Chauncy PassyMeza, Talya Quain E, RN Outcome: Progressing 05/03/2018 0157 by Chauncy PassyMeza, Wyley Hack E, RN Outcome: Progressing Goal: Will remain free from infection 05/03/2018 0524 by Chauncy PassyMeza, Cauy Melody E, RN Outcome: Progressing 05/03/2018 0157 by Chauncy PassyMeza, Fahd Galea E, RN Outcome: Progressing Goal: Diagnostic test results will improve 05/03/2018 0524 by Chauncy PassyMeza, Stormi Vandevelde E, RN Outcome: Progressing 05/03/2018 0157 by Chauncy PassyMeza, Kischa Altice E, RN Outcome: Progressing Goal: Respiratory complications will improve 05/03/2018 0524 by Chauncy PassyMeza, Keelie Zemanek E, RN Outcome: Progressing 05/03/2018 0157 by Chauncy PassyMeza, Dadrian Ballantine E, RN Outcome: Progressing Goal: Cardiovascular complication will be avoided 05/03/2018 0524 by Chauncy PassyMeza, Shia Delaine E, RN Outcome: Progressing 05/03/2018 0157 by Chauncy PassyMeza, Merle Cirelli E, RN Outcome: Progressing   Problem: Activity: Goal: Risk for activity intolerance will decrease 05/03/2018 0524 by Chauncy PassyMeza, Danial Sisley E, RN Outcome: Progressing 05/03/2018 0157 by Chauncy PassyMeza, Doneisha Ivey E, RN Outcome: Progressing   Problem: Nutrition: Goal: Adequate nutrition will be maintained 05/03/2018 0524 by Chauncy PassyMeza, Sarika Baldini E, RN Outcome: Progressing 05/03/2018 0157 by Chauncy PassyMeza, Samin Milke E, RN Outcome: Progressing   Problem: Coping: Goal: Level of anxiety will decrease 05/03/2018 0524 by Chauncy PassyMeza, Milli Woolridge E, RN Outcome: Progressing 05/03/2018 0157 by Chauncy PassyMeza,  Akelia Husted E, RN Outcome: Progressing   Problem: Elimination: Goal: Will not experience complications related to bowel motility 05/03/2018 0524 by Chauncy PassyMeza, Chandria Rookstool E, RN Outcome: Progressing 05/03/2018 0157 by Chauncy PassyMeza, Ebbie Cherry E, RN Outcome: Progressing Goal: Will not experience complications related to urinary retention 05/03/2018 0524 by Chauncy PassyMeza, Keion Neels E, RN Outcome: Progressing 05/03/2018 0157 by Chauncy PassyMeza, Frans Valente E, RN Outcome: Progressing   Problem: Pain Managment: Goal: General experience of comfort will improve 05/03/2018 0524 by Chauncy PassyMeza, Rachard Isidro E, RN Outcome: Progressing 05/03/2018 0157 by Chauncy PassyMeza, Daksha Koone E, RN Outcome: Progressing   Problem: Safety: Goal: Ability to remain free from injury will improve 05/03/2018 0524 by Chauncy PassyMeza, Tandre Conly E, RN Outcome: Progressing 05/03/2018 0157 by Chauncy PassyMeza, Jahni Nazar E, RN Outcome: Progressing   Problem: Skin Integrity: Goal: Risk for impaired skin integrity will decrease 05/03/2018 0524 by Chauncy PassyMeza, Darinda Stuteville E, RN Outcome: Progressing 05/03/2018 0157 by Chauncy PassyMeza, Jori Frerichs E, RN Outcome: Progressing   Problem: Cardiac: Goal: Ability to achieve and maintain adequate cardiopulmonary perfusion will improve Outcome: Progressing   Problem: Neurologic: Goal: Promote progressive neurologic recovery Outcome: Progressing

## 2018-05-03 NOTE — Progress Notes (Signed)
Artic sun was programmed to re-warm pt. Verified by second RN, Holland Fallingebecca Sarine.

## 2018-05-03 NOTE — Assessment & Plan Note (Addendum)
Patient was eating at the time of arrest.  CBC    Component Value Date/Time   WBC 21.9 (H) 04/18/2018 1717   RBC 5.92 (H) 04/24/2018 1717   HGB 17.1 (H) 04/17/2018 1717   HCT 53.2 (H) 04/13/2018 1717   PLT 206 04/26/2018 1717   MCV 89.9 05/10/2018 1717   MCH 28.9 05/01/2018 1717   MCHC 32.1 04/17/2018 1717   RDW 11.9 04/30/2018 1717   LYMPHSABS 8.8 (H) 04/27/2018 1717   MONOABS 0.9 04/15/2018 1717   EOSABS 0.2 05/06/2018 1717   BASOSABS 0.2 (H) 05/05/2018 1717   CXR: Streaky opacification of the right upper lobe consistent with aspiration (chest x-ray personally reviewed)  Assessment and plan: Aspiration pneumonia.  Upper lobe predominance suggests aspiration occurred following cardiac arrest.  We will complete a 7-day course of Zosyn. Initiate bed chest physiotherapy to mobilize secretions as patient is deeply sedated and has no spontaneous cough.

## 2018-05-03 NOTE — Plan of Care (Signed)

## 2018-05-04 ENCOUNTER — Encounter (HOSPITAL_COMMUNITY): Payer: Self-pay | Admitting: Neurology

## 2018-05-04 ENCOUNTER — Inpatient Hospital Stay (HOSPITAL_COMMUNITY): Payer: 59

## 2018-05-04 LAB — GLUCOSE, CAPILLARY
Glucose-Capillary: 104 mg/dL — ABNORMAL HIGH (ref 70–99)
Glucose-Capillary: 114 mg/dL — ABNORMAL HIGH (ref 70–99)
Glucose-Capillary: 116 mg/dL — ABNORMAL HIGH (ref 70–99)
Glucose-Capillary: 128 mg/dL — ABNORMAL HIGH (ref 70–99)
Glucose-Capillary: 137 mg/dL — ABNORMAL HIGH (ref 70–99)
Glucose-Capillary: 184 mg/dL — ABNORMAL HIGH (ref 70–99)
Glucose-Capillary: 98 mg/dL (ref 70–99)

## 2018-05-04 LAB — BASIC METABOLIC PANEL
Anion gap: 9 (ref 5–15)
BUN: 17 mg/dL (ref 6–20)
CO2: 25 mmol/L (ref 22–32)
Calcium: 7.7 mg/dL — ABNORMAL LOW (ref 8.9–10.3)
Chloride: 107 mmol/L (ref 98–111)
Creatinine, Ser: 0.75 mg/dL (ref 0.61–1.24)
GFR calc non Af Amer: >60 mL/min (ref >60)
Glucose, Bld: 131 mg/dL — ABNORMAL HIGH (ref 70–99)
POTASSIUM: 2.9 mmol/L — AB (ref 3.5–5.1)
Sodium: 141 mmol/L (ref 135–145)

## 2018-05-04 LAB — PROCALCITONIN: PROCALCITONIN: 4.62 ng/mL

## 2018-05-04 MED ORDER — NICARDIPINE HCL IN NACL 40-0.83 MG/200ML-% IV SOLN
3.0000 mg/h | INTRAVENOUS | Status: DC
  Administered 2018-05-04: 5 mg/h via INTRAVENOUS
  Administered 2018-05-04: 7.5 mg/h via INTRAVENOUS
  Administered 2018-05-05: 15 mg/h via INTRAVENOUS
  Administered 2018-05-05 (×2): 5 mg/h via INTRAVENOUS
  Administered 2018-05-05: 12.5 mg/h via INTRAVENOUS
  Filled 2018-05-04 (×6): qty 200

## 2018-05-04 MED ORDER — NICARDIPINE HCL IN NACL 20-0.86 MG/200ML-% IV SOLN
3.0000 mg/h | INTRAVENOUS | Status: DC
  Filled 2018-05-04: qty 200

## 2018-05-04 MED ORDER — PRO-STAT SUGAR FREE PO LIQD
30.0000 mL | Freq: Two times a day (BID) | ORAL | Status: DC
  Administered 2018-05-04 – 2018-05-07 (×6): 30 mL
  Filled 2018-05-04 (×6): qty 30

## 2018-05-04 MED ORDER — HYDRALAZINE HCL 20 MG/ML IJ SOLN
20.0000 mg | Freq: Four times a day (QID) | INTRAMUSCULAR | Status: DC | PRN
  Administered 2018-05-04 – 2018-05-05 (×3): 20 mg via INTRAVENOUS
  Filled 2018-05-04 (×2): qty 1

## 2018-05-04 MED ORDER — HYDRALAZINE HCL 20 MG/ML IJ SOLN
INTRAMUSCULAR | Status: AC
  Administered 2018-05-04: 20 mg via INTRAVENOUS
  Filled 2018-05-04: qty 1

## 2018-05-04 MED ORDER — LABETALOL HCL 5 MG/ML IV SOLN
20.0000 mg | Freq: Four times a day (QID) | INTRAVENOUS | Status: DC | PRN
  Administered 2018-05-04 (×3): 20 mg via INTRAVENOUS
  Filled 2018-05-04 (×3): qty 4

## 2018-05-04 MED ORDER — SCOPOLAMINE 1 MG/3DAYS TD PT72
1.0000 | MEDICATED_PATCH | TRANSDERMAL | Status: DC
Start: 2018-05-04 — End: 2018-05-11
  Administered 2018-05-04: 1.5 mg via TRANSDERMAL
  Filled 2018-05-04 (×2): qty 1

## 2018-05-04 MED ORDER — POTASSIUM CHLORIDE 10 MEQ/50ML IV SOLN
10.0000 meq | INTRAVENOUS | Status: AC
  Administered 2018-05-04 (×4): 10 meq via INTRAVENOUS
  Filled 2018-05-04 (×4): qty 50

## 2018-05-04 MED ORDER — VITAL HIGH PROTEIN PO LIQD
1000.0000 mL | ORAL | Status: DC
  Administered 2018-05-04: 1000 mL

## 2018-05-04 NOTE — Progress Notes (Signed)
Bedside EEG completed, results pending. 

## 2018-05-04 NOTE — Progress Notes (Signed)
RT placed a oral airway/bite block due to pt biting on ET tube.  RN and family at bedside.

## 2018-05-04 NOTE — Consult Note (Addendum)
Neurology Consultation  Reason for Consult: Prognostication Referring Physician: ROSENBLATT  History is obtained from: Chart  HPI: Jay Krueger is a 32 y.o. male with mental retardation and history of possible seizures.  Family tells me that he has had episodes for prolonged period of time, he has been seen at both [facility], [place], [facility] neurology.  He has had multiple EEGs all of which have not shown any epileptiform activity.  Father believes he has had a prolonged EEG that did not show any epileptiform activity.  He was diagnosed with paroxysmal non-Kinesiogenic dyskinesia.  Patient is watched by a caregiver.  Apparently he was eating gummy bears when he was seen last by the caregiver when she returned he was unresponsive and pulseless.  There is unknown.  A time to which he was unresponsive and pulseless.  When EMS arrived he was treated for PEA followed by asystole with 45 minutes of ACLS.  Currently patient is on multiple sedating medications secondary to elevated blood pressure when he is medications are turned off. He is also currently being treated for aspiration pneumonia.  ROS:   Unable to obtain due to altered mental status.   Past Medical History:  Diagnosis Date  . Mental retardation   . Seizure [facility]     Family History  Problem Relation Age of Onset  . Hypertension Mother   . Hypertension Father     Social History:   reports that he has never smoked. He has never used smokeless tobacco. He reports that he does not drink alcohol or use drugs.  Medications  Current Facility-Administered Medications:  .  0.9 %  sodium chloride infusion, , Intravenous, Continuous, Sampson Goon, MD, Last Rate: 10 mL/hr at 05/04/18 1540 .  chlorhexidine gluconate (MEDLINE KIT) (PERIDEX) 0.12 % solution 15 mL, 15 mL, Mouth Rinse, BID, Sampson Goon, MD, 15 mL at 05/04/18 0820 .  Chlorhexidine Gluconate Cloth 2 % PADS 6 each, 6 each, Topical, Daily, Sampson Goon, MD, 6 each at 05/03/18  2100 .  famotidine (PEPCID) IVPB 20 mg premix, 20 mg, Intravenous, Q12H, Mauri Brooklyn, MD, Stopped at 05/04/18 1130 .  feeding supplement (VITAL HIGH PROTEIN) liquid 1,000 mL, 1,000 mL, Per Tube, Q24H, Agarwala, Ravi, MD, 1,000 mL at 05/03/18 1628 .  fentaNYL 2534mg in NS 2521m(1089mml) infusion-PREMIX, 0-400 mcg/hr, Intravenous, Continuous, SmiMauri BrooklynD, Last Rate: 5 mL/hr at 05/04/18 1540, 50 mcg/hr at 05/04/18 1540 .  heparin injection 5,000 Units, 5,000 Units, Subcutaneous, Q8H, GraSampson GoonD, 5,000 Units at 05/04/18 1409 .  hydrALAZINE (APRESOLINE) injection 20 mg, 20 mg, Intravenous, Q6H PRN, RosTarry KosD, 20 mg at 05/04/18 1111 .  insulin aspart (novoLOG) injection 0-9 Units, 0-9 Units, Subcutaneous, TID WC, GraSampson GoonD, 2 Units at 05/03/18 [phone] labetalol (NORMODYNE,TRANDATE) injection 20 mg, 20 mg, Intravenous, Q6H PRN, RosTarry KosD, 20 mg at 05/04/18 0827 .  lactated ringers bolus 1,000 mL, 1,000 mL, Intravenous, Once, MacPrescilla SoursD .  MEDLINE mouth rinse, 15 mL, Mouth Rinse, 10 times per day, GraSampson GoonD, 15 mL at 05/04/18 1409 .  midazolam (VERSED) 100 mg in sodium chloride 0.9 % 100 mL (1 mg/mL) infusion, 0-10 mg/hr, Intravenous, Continuous, Agarwala, Ravi, MD, Stopped at 05/04/18 1539 .  nicardipine (CARDENE) 48m60m 0.83% saline 200ml53mDOUBLE STRENGTH infusion (0.2 mg/ml), 3-15 mg/hr, Intravenous, Continuous, RosenTarry Kos Last Rate: 25 mL/hr at 05/04/18 1540, 5 mg/hr at 05/04/18 1540 .  norepinephrine (LEVOPHED) 26m in D5W 2527mpremix infusion, 0-50 mcg/min, Intravenous, Titrated, GrSampson GoonMD .  piperacillin-tazobactam (ZOSYN) IVPB 3.375 g, 3.375 g, Intravenous, Q8H, GrSampson GoonMD, Stopped at 05/04/18 1539 .  sodium chloride flush (NS) 0.9 % injection 10-40 mL, 10-40 mL, Intracatheter, PRN, GrSampson GoonMD .  valproate (DEPACON) 200 mg in dextrose 5 % 50 mL IVPB, 200 mg, Intravenous, Q8H, GrSampson GoonMD, Stopped at 05/04/18 1514   Exam: Current vital signs: BP 138/68   Pulse (!) 112   Temp 98.4 F (36.9 C)   Resp (!) 39   Ht _0  (1.778 m)   Wt 125.2 kg (276 lb 0.3 oz)   SpO2 100%   BMI 39.60 kg/m  Vital signs in last 24 hours: Temp:  [95.4 F (35.2 C)-99 F (37.2 C)] 98.4 F (36.9 C) (07/22 1407) Pulse Rate:  [81-117] 112 (07/22 1518) Resp:  [12-39] 39 (07/22 1500) BP: (108-219)/(64-107) 138/68 (07/22 1518) SpO2:  [96 %-100 %] 100 % (07/22 1518) Arterial Line BP: (85-218)/(61-95) 140/63 (07/22 1500) FiO2 (%):  [40 %] 40 % (07/22 1518) Weight:  [125.2 kg (276 lb 0.3 oz)] 125.2 kg (276 lb 0.3 oz) (07/22 0432)  GENERAL:  NAD HEENT: - Normocephalic  Ext: warm, well perfused, intact peripheral pulses, __ edema  NEURO:  Mental Status: Currently intubated, no response to noxious stimuli, does not follow any verbal stimuli. Language: Currently intubated Cranial Nerves: 2 mm nonreactive.  Disconjugate with no doll's or corneal reflex Motor: Flaccid throughout with 2+ reflexes in the brachioradialis and bicep no reflexes at the knee jerk or ankle jerk Tone: Flaccid throughout Sensation-no response noxious stimuli Coordination: Furred Gait-deferred     Labs I have reviewed labs in epic and the results pertinent to this consultation are:   CBC    Component Value Date/Time   WBC 21.9 (H) 04/30/2018 1717   RBC 5.92 (H) 05/04/2018 1717   HGB 17.1 (H) 04/18/2018 1717   HCT 53.2 (H) 05/13/2018 1717   PLT 206 05/09/2018 1717   MCV 89.9 05/10/2018 1717   MCH 28.9 04/30/2018 1717   MCHC 32.1 04/14/2018 1717   RDW 11.9 05/01/2018 1717   LYMPHSABS 8.8 (H) 04/30/2018 1717   MONOABS 0.9 04/18/2018 1717   EOSABS 0.2 04/14/2018 1717   BASOSABS 0.2 (H) 05/13/2018 1717    CMP     Component Value Date/Time   NA 141 05/04/2018 0406   K 2.9 (L) 05/04/2018 0406   CL 107 05/04/2018 0406   CO2 25 05/04/2018 0406   GLUCOSE 131 (H) 05/04/2018 0406   BUN 17  05/04/2018 0406   CREATININE 0.75 05/04/2018 0406   CALCIUM 7.7 (L) 05/04/2018 0406   PROT 6.6 04/20/2018 1717   ALBUMIN 3.6 04/17/2018 1717   AST 147 (H) 05/11/2018 1717   ALT 154 (H) 05/03/2018 1717   ALKPHOS 106 04/25/2018 1717   BILITOT 0.8 04/24/2018 1717   GFRNONAA >60 05/04/2018 0406   GFRAA >60 05/04/2018 0406    Lipid Panel  No results found for: CHOL, TRIG, HDL, CHOLHDL, VLDL, LDLCALC, LDLDIRECT   Imaging I have reviewed the images obtained:  CT-scan of the brain--no loss of gray-white matter, no acute or subacute infarcts  MRI examination of the brain--pending  EEG: IMPRESSION: This is an abnormal electroencephalogram due to the absence of background rhythm noted at the routine sensitivity of 7uv/mm and with increase to 3uV/mm.  No epileptiform activity is noted.  Assessment: 32 year old male status post cardiac arrest with unknown downtime and 45 minutes of CPR before ROSC.  Patient underwent  cooling protocol and rewarmed at 300 hours this a.m.    Recommendations: - We will continue to monitor patient, too early to prognosticate. Limited exam as is he just being weaned off sedation.  - Have discussed with PCCM about decreasing sedating medications as much as possible.  This is been difficult recently as once the medications have been decreased patient has become hypertensive. Would avoid using Versed.  -MRI brain without contrast tomorrow to help with prognostication if no clinical improvement.    NEUROHOSPITALIST ADDENDUM Seen and examined the patient today. I have reviewed the contents of history and physical exam as documented by PA/ARNP/Resident and agree with above documentation.  I have discussed and formulated the above plan as documented. Edits to the note have been made as needed.   Impression:  Examination limited as patient just being weaned off sedation.  We will get a more accurate exam tomorrow as long as he does not receive significant  doses of sedation, particularly midozalam.  Too early to prognosticate at this time.  We will continue to follow.    Karena Addison Aroor MD [facility] [phone]   If 7pm to 7am, please call on call as listed on AMION.

## 2018-05-04 NOTE — Progress Notes (Signed)
Initial Nutrition Assessment  DOCUMENTATION CODES:   Obesity unspecified  INTERVENTION:   Increase Vital High Protein to 60 ml/hr (1440 ml/day) 30 ml Prostat BID  Provides: 1640 kcal, 156 grams protein, and 1203 ml free water.    NUTRITION DIAGNOSIS:   Inadequate oral intake related to inability to eat as evidenced by NPO status.  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    Started on 36 degree TTM.  Pt being treated for aspiration PNA which occurred following arrest. Per neurology likely very poor prognosis.   Spoke with mom who was at bedside. She reports that pt has gained weight and has a really good appetite. She states son is disabled and does not exercise but is able to get around and do what he wants to.   Patient is currently intubated on ventilator support Temp (24hrs), Avg:97.4 F (36.3 C), Min:95.4 F (35.2 C), Max:99 F (37.2 C)  Medications reviewed and include: SSI Labs reviewed: K+ 2.9 (L)   TF via OG tube Vital High Protein @ 20 ml/hr  Provides: 480 kcal, 42 grams protein, and 401 ml free water.   NUTRITION - FOCUSED PHYSICAL EXAM:    Most Recent Value  Orbital Region  No depletion  Upper Arm Region  No depletion  Thoracic and Lumbar Region  Unable to assess  Buccal Region  Unable to assess  Temple Region  No depletion  Clavicle Bone Region  No depletion  Clavicle and Acromion Bone Region  No depletion  Scapular Bone Region  Unable to assess  Dorsal Hand  No depletion  Patellar Region  No depletion  Anterior Thigh Region  Unable to assess  Posterior Calf Region  No depletion  Edema (RD Assessment)  Mild  Hair  Reviewed  Eyes  Unable to  assess  Mouth  Unable to assess  Skin  Reviewed  Nails  Reviewed       Diet Order:   Diet Order           Diet NPO time specified  Diet effective now          EDUCATION NEEDS:   No education needs have been identified at this time  Skin:  Skin Assessment: Reviewed RN Assessment  Last BM:  unknown  Height:   Ht Readings from Last 1 Encounters:  04/21/2018 5\' 10"  (1.778 m)    Weight:   Wt Readings from Last 1 Encounters:  05/04/18 276 lb 0.3 oz (125.2 kg)    Ideal Body Weight:  75.4 kg  BMI:  Body mass index is 39.6 kg/m.  Estimated Nutritional Needs:   Kcal:  1360-1731  Protein:  >/= 150 grams  Fluid:  > 1.5 L/day   Kendell BaneHeather Tandy Lewin RD, LDN, CNSC [phone] Pager [phone] After Hours Pager

## 2018-05-04 NOTE — Plan of Care (Signed)
  Problem: Education: Goal: Knowledge of General Education information will improve Description Including pain rating scale, medication(s)/side effects and non-pharmacologic comfort measures Outcome: Progressing   Problem: Health Behavior/Discharge Planning: Goal: Ability to manage health-related needs will improve Outcome: Progressing   Problem: Clinical Measurements: Goal: Ability to maintain clinical measurements within normal limits will improve Outcome: Progressing Goal: Will remain free from infection Outcome: Progressing Goal: Diagnostic test results will improve Outcome: Progressing Goal: Respiratory complications will improve Outcome: Progressing Goal: Cardiovascular complication will be avoided Outcome: Progressing   Problem: Activity: Goal: Risk for activity intolerance will decrease Outcome: Progressing   Problem: Nutrition: Goal: Adequate nutrition will be maintained Outcome: Progressing   Problem: Coping: Goal: Level of anxiety will decrease Outcome: Progressing   Problem: Elimination: Goal: Will not experience complications related to bowel motility Outcome: Progressing Goal: Will not experience complications related to urinary retention Outcome: Progressing   Problem: Pain Managment: Goal: General experience of comfort will improve Outcome: Progressing   Problem: Safety: Goal: Ability to remain free from injury will improve Outcome: Progressing   Problem: Skin Integrity: Goal: Risk for impaired skin integrity will decrease Outcome: Progressing   Problem: Cardiac: Goal: Ability to achieve and maintain adequate cardiopulmonary perfusion will improve Outcome: Progressing   Problem: Neurologic: Goal: Promote progressive neurologic recovery Outcome: Progressing

## 2018-05-04 NOTE — Progress Notes (Signed)
eLink Physician-Brief Progress Note Patient Name: Jay Krueger DOB: 03/16/1986 MRN: [id number]   Date of Service  05/04/2018  HPI/Events of Note  Low potassium  eICU Interventions  replaced     Intervention Category Minor Interventions: Electrolytes abnormality - evaluation and management  Henry RusselSMITH, Mahamadou Weltz, P 05/04/2018, 5:24 AM

## 2018-05-04 NOTE — Procedures (Signed)
ELECTROENCEPHALOGRAM REPORT   Patient: Jay Krueger       Room #: [facility]2H05C EEG No. ID: [id number] Age: 32 y.o.        Sex: male Referring Physician: Franchot Erichsenosenblatt Report Date:  05/04/2018        Interpreting Physician: Thana FarrEYNOLDS, Crue Otero  History: Jay Cuyler is an 32 y.o. male s/p arrest  Medications:  Pepcid, Insulin, Zosyn, Depacon, Versed, Fentanyl, Cardene, Levophed  Conditions of Recording:  This is a 21 channel routine scalp EEG performed with bipolar and monopolar montages arranged in accordance to the international 10/20 system of electrode placement. One channel was dedicated to EKG recording.  The patient is in the intubated and sedated state.  Description:  The background activity is markedly attenuated.  No background can be appreciated other than EKG artifact.  Sensitivity was increased to 3uV/mm and despite this increase in sensitiviyy no background rhythm could be appreciated.  This was persistent throughout the recording.   No epileptiform activity is noted.   Hyperventilation and intermittent photic stimulation were not performed.   IMPRESSION: This is an abnormal electroencephalogram due to the absence of background rhythm noted at the routine sensitivity of 7uv/mm and with increase to 3uV/mm.  No epileptiform activity is noted.     Thana FarrLeslie Janay Canan, MD Neurology [phone] 05/04/2018, 1:49 PM

## 2018-05-05 ENCOUNTER — Inpatient Hospital Stay (HOSPITAL_COMMUNITY): Payer: 59

## 2018-05-05 DIAGNOSIS — G931 Anoxic brain damage, not elsewhere classified: Secondary | ICD-10-CM

## 2018-05-05 DIAGNOSIS — J9601 Acute respiratory failure with hypoxia: Secondary | ICD-10-CM

## 2018-05-05 LAB — COMPREHENSIVE METABOLIC PANEL
ALBUMIN: 3.1 g/dL — AB (ref 3.5–5.0)
ALK PHOS: 59 U/L (ref 38–126)
ALT: 51 U/L — ABNORMAL HIGH (ref 0–44)
AST: 100 U/L — ABNORMAL HIGH (ref 15–41)
Anion gap: 7 (ref 5–15)
BUN: 15 mg/dL (ref 6–20)
CALCIUM: 8.4 mg/dL — AB (ref 8.9–10.3)
CHLORIDE: 108 mmol/L (ref 98–111)
CO2: 24 mmol/L (ref 22–32)
Creatinine, Ser: 0.65 mg/dL (ref 0.61–1.24)
GFR calc Af Amer: >60 mL/min (ref >60)
GFR calc non Af Amer: >60 mL/min (ref >60)
GLUCOSE: 134 mg/dL — AB (ref 70–99)
Potassium: 3.4 mmol/L — ABNORMAL LOW (ref 3.5–5.1)
SODIUM: 139 mmol/L (ref 135–145)
Total Bilirubin: 0.6 mg/dL (ref 0.3–1.2)
Total Protein: 6.2 g/dL — ABNORMAL LOW (ref 6.5–8.1)

## 2018-05-05 LAB — POCT I-STAT 3, ART BLOOD GAS (G3+)
ACID-BASE DEFICIT: 2 mmol/L (ref 0.0–2.0)
BICARBONATE: 21.8 mmol/L (ref 20.0–28.0)
O2 Saturation: 100 %
PCO2 ART: 31.6 mmHg — AB (ref 32.0–48.0)
PH ART: 7.445 (ref 7.350–7.450)
PO2 ART: 192 mmHg — AB (ref 83.0–108.0)
Patient temperature: 36.5
TCO2: 23 mmol/L (ref 22–32)

## 2018-05-05 LAB — CBC
HCT: 43 % (ref 39.0–52.0)
Hemoglobin: 14.2 g/dL (ref 13.0–17.0)
MCH: 28.9 pg (ref 26.0–34.0)
MCHC: 33 g/dL (ref 30.0–36.0)
MCV: 87.4 fL (ref 78.0–100.0)
PLATELETS: 164 10*3/uL (ref 150–400)
RBC: 4.92 MIL/uL (ref 4.22–5.81)
RDW: 13 % (ref 11.5–15.5)
WBC: 15.8 10*3/uL — AB (ref 4.0–10.5)

## 2018-05-05 LAB — GLUCOSE, CAPILLARY
GLUCOSE-CAPILLARY: 148 mg/dL — AB (ref 70–99)
GLUCOSE-CAPILLARY: 137 mg/dL — AB (ref 70–99)
GLUCOSE-CAPILLARY: 126 mg/dL — AB (ref 70–99)
Glucose-Capillary: 185 mg/dL — ABNORMAL HIGH (ref 70–99)
Glucose-Capillary: 137 mg/dL — ABNORMAL HIGH (ref 70–99)

## 2018-05-05 LAB — BASIC METABOLIC PANEL
ANION GAP: 9 (ref 5–15)
BUN: 14 mg/dL (ref 6–20)
CO2: 21 mmol/L — ABNORMAL LOW (ref 22–32)
Calcium: 8 mg/dL — ABNORMAL LOW (ref 8.9–10.3)
Chloride: 108 mmol/L (ref 98–111)
Creatinine, Ser: 0.65 mg/dL (ref 0.61–1.24)
GFR calc Af Amer: >60 mL/min (ref >60)
Glucose, Bld: 182 mg/dL — ABNORMAL HIGH (ref 70–99)
POTASSIUM: 3.2 mmol/L — AB (ref 3.5–5.1)
SODIUM: 138 mmol/L (ref 135–145)

## 2018-05-05 LAB — PROCALCITONIN: PROCALCITONIN: 1.82 ng/mL

## 2018-05-05 LAB — AMMONIA: AMMONIA: 36 umol/L — AB (ref 9–35)

## 2018-05-05 MED ORDER — EMPTY CONTAINERS FLEXIBLE MISC
50.0000 g | Freq: Once | Status: AC
  Administered 2018-05-05: 50 g via INTRAVENOUS
  Filled 2018-05-05: qty 250

## 2018-05-05 MED ORDER — ACETAMINOPHEN 160 MG/5ML PO SOLN
650.0000 mg | ORAL | Status: DC | PRN
  Administered 2018-05-05 – 2018-05-06 (×2): 650 mg via ORAL
  Filled 2018-05-05: qty 20.3

## 2018-05-05 MED ORDER — LABETALOL HCL 5 MG/ML IV SOLN
0.5000 mg/min | INTRAVENOUS | Status: DC
  Administered 2018-05-05 – 2018-05-06 (×3): 1 mg/min via INTRAVENOUS
  Filled 2018-05-05: qty 80
  Filled 2018-05-05 (×2): qty 100
  Filled 2018-05-05: qty 20

## 2018-05-05 MED ORDER — CHLORHEXIDINE GLUCONATE 0.12 % MT SOLN
OROMUCOSAL | Status: AC
  Administered 2018-05-05: 15 mL via OROMUCOSAL
  Filled 2018-05-05: qty 15

## 2018-05-05 MED ORDER — POTASSIUM CHLORIDE 20 MEQ/15ML (10%) PO SOLN
30.0000 meq | ORAL | Status: AC
  Administered 2018-05-05 (×2): 30 meq
  Filled 2018-05-05 (×2): qty 30

## 2018-05-05 NOTE — Progress Notes (Signed)
180mL Versed wasted down sink with Johnnye SimaKatelyn Stanley, RN.

## 2018-05-05 NOTE — Care Management Note (Signed)
Case Management Note Donn PieriniKristi Arissa Fagin RN,BSN Unit [facility] 1-22 Case Manager  [phone]  Patient Details  Name: [place] Spenser MRN: [id number] Date of Birth: 07/08/1986  Subjective/Objective:   Pt admitted s/p cardiac arrest                 Action/Plan: PTA pt lived at home, CM called by unit to speak with family regarding insurance questions. Came to beside and spoke with pt's mother and sister regarding questions about insurance coverage, they had questions about making sure coverage was showing in epic both [facility] policy and Medicaid- [facility] and instructed them to go by admitting to have them make copy of Medicaid card and be sure to add that to the coverage in Epic- family to bring in current  Medicaid card and stop by admitting. Explained to family that [facility] and auth in pending. CM to follow along for transition of care needs- per MD notes pt may have suffered anoxic injury.   Expected Discharge Date:                  Expected Discharge Plan:     In-House Referral:  Clinical Social Work  Discharge planning Services  CM Consult, Other - See comment  Post Acute Care Choice:    Choice offered to:     DME Arranged:    DME Agency:     HH Arranged:    HH Agency:     Status of Service:  In process, will continue to follow  If discussed at Long Length of Stay Meetings, dates discussed:    Discharge Disposition:   Additional Comments:  Darrold SpanWebster, Desmin Daleo Hall, RN 05/05/2018, 10:15 AM

## 2018-05-05 NOTE — Progress Notes (Addendum)
Subjective: Patient remains intubated.  Is been off Versed since 3:15 PM yesterday and currently off fentanyl since 10 PM yesterday.  When ventilator turned off he does take a few breaths.  MRI remains to be pending  Exam: Vitals:   05/05/18 0718 05/05/18 0800  BP: (!) 115/56 128/66  Pulse: 82 79  Resp:  (!) 24  Temp:    SpO2: 100% 100%    Physical Exam   HEENT-  Normocephalic, no lesions, without obvious abnormality.  Normal external eye and conjunctiva.   Musculoskeletal-no joint tenderness, deformity or swelling Skin-warm and dry, no hyperpigmentation, vitiligo, or suspicious lesions    Neuro:  Mental Status: Patient does not respond to verbal stimuli.  Does not respond to deep sternal rub.  Does not follow commands.  No verbalizations noted.  When ventilator is turned off he does take a few breaths however when set at 24 he is not breathing over the vent Cranial Nerves: II: patient does not respond confrontation bilaterally,  III,IV,VI: doll's response absent bilaterally. pupils right 2 mm, left 2 mm,and nonreactive bilaterally, disconjugate V,VII: corneal reflex absent bilaterally  VIII: patient does not respond to verbal stimuli IX,X: gag reflex absent, XI: trapezius strength unable to test bilaterally XII: tongue strength unable to test Motor: Extremities flaccid throughout.  No spontaneous movement noted.  No purposeful movements noted. Sensory: Does not respond to noxious stimuli in any extremity. Deep Tendon Reflexes:  No deep tendon reflexes at the knees or ankles.  He does have 2+ deep tendon reflexes at the brachioradialis and biceps Plantars: absent bilaterally Cerebellar: Unable to perform    Medications:  Scheduled: . chlorhexidine gluconate (MEDLINE KIT)  15 mL Mouth Rinse BID  . Chlorhexidine Gluconate Cloth  6 each Topical Daily  . feeding supplement (PRO-STAT SUGAR FREE 64)  30 mL Per Tube BID  . heparin  5,000 Units Subcutaneous Q8H  . insulin  aspart  0-9 Units Subcutaneous TID WC  . mouth rinse  15 mL Mouth Rinse 10 times per day  . potassium chloride  30 mEq Per Tube Q4H  . scopolamine  1 patch Transdermal Q72H    Pertinent Labs/Diagnostics: Potassium 3.2 Glucose 182 MRI brain pending     Etta Quill PA-C [facility] [phone]   Assessment: 32 year old male status post cardiac arrest with unknown downtime and 45 minutes of CPR before ROSC.  Patient did under go the hypothermia  protocol and now has been rewarmed about approximately 30 hours ago.  Exam is unchanged from yesterday.  Given he is not 24 hours off sedation cannot make final clinical decision. Prognostication gaurded at this time, however high concern for poor prognosis. Will obtain MRI brain to assist prognostication.   Recommendations: -MRI brain -We will reexamine tomorrow. Please hold all sedating medicatons, but if needed please avoid Midozolam.    05/05/2018, 9:43 AM    NEUROHOSPITALIST ADDENDUM Seen and examined the patient today. I have reviewed the contents of history and physical exam as documented by PA/ARNP/Resident and agree with above documentation.  I have discussed and formulated the above plan as documented. Edits to the note have been made as needed.  Examined patient today, no pupillary response, corneals, oculocephalic and gag reflex. No motor withdrawal. Very concerned patient will have poor prognosis. However he is young and it has not been 24hrs after sedation. Will rexamine him tomorrow. Also we can get MR brain to help with prognostication. Labs do not show severe uremia, normal Na.  Will add Ammonia.  Karena Addison Dennies Coate MD [facility] [phone]   If 7pm to 7am, please call on call as listed on AMION.

## 2018-05-05 NOTE — Progress Notes (Signed)
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 His puil is about 7-518mm and totally unreactive. It was slightly larger several hous ago. Since the injury is due to anoxia  There is l;ittle to do therapeutically. I am giving a dose of mannitol. I spoke to his family and expressed that this is poor prognostic indicator. I let neurolgy know as well. We will get a stat CT of the head. The family still wants a MRI but I told them it is not likely to change the management.

## 2018-05-05 NOTE — Progress Notes (Signed)
[facility] LAB REPLAC[facility] ONLY  The patient does [facility] the [facility] Protocol based on the criteria listed below:   1. Is GFR >/= 40 ml/min? Yes.    Patient's GFR today is >60 2. Is urine output >/= 0.5 ml/kg/hr for the last 6 hours? Yes.   Patient's UOP is .7 ml/kg/hr 3. Is BUN < 60 mg/dL? Yes.    Patient's BUN today is 14 4. Abnormal electrolyte(s): K-3.2 5. Ordered repletion with: per protocol 6. If a panic level lab has been reported, has the CCM MD in charge been notified? Yes.  .   Physician:  Dr. Juline PatchYacoub  Jay Krueger, Jay BoosMaria Krueger 05/05/2018 6:27 AM

## 2018-05-05 NOTE — Progress Notes (Signed)
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  The disorder is manifested as involuntary limb movements and formations which can last anywhere from 30 minutes to 10 hours and during which his mental status does not significantly change.  He was in his usual state of health this morning with no indications of a superimposed acute illness, no chest pains no fevers no chills no sweats no coughs.  He was eating some gummy bears when last seen by his caretaker.  When she returned to the room he was unresponsive and she felt he was pulseless.  EMS was called and he was treated for PEA followed by asystole with 45 minutes of ACLS. ROSC was achieved, and he is now in the emergency room on no pressors intubated and mechanically ventilated.  He is sedated on 10 mcg of propofol at the time of my examination.  7/22 The patient remains on the ventilator. He remains on a versed infusion at 4 mcg/hr and of Fentanyl. He has finished rewarming. He is not moving presently. Does not withdraw to painful stimuli. EEG was completetd this AM. I have asked neurolgy to see the patient for prognostic purposes.  7/23 The patient remains on the ventialtor. He was just seen by neurology. He does nit move. His cough and gag are not existent. He does have DTRs in the arms and legs. His EEG was a very suppressed recording. When I placed thepatient oon CPAP he did trtake an occasional breath. MRI is scheduled for today. Neurology spoke to the family. They want to observe thepatient another day off sedation. We are all of the mind that the prognosis for neurolgical recovery is poor at this time.  PAST MEDICAL HISTORY :  He  has a  past medical history of Mental retardation and Seizure (HCC).  PAST SURGICAL HISTORY: He  has a past surgical history that includes Hernia repair.  No Known Allergies  No current facility-administered medications on file prior to encounter.    Current Outpatient Medications on File Prior to Encounter  Medication Sig  . ALPRAZolam (XANAX) 0.5 MG tablet Take 0.5 mg by mouth 3 (three) times daily.  . cloNIDine (CATAPRES) 0.1 MG tablet Take 1 tablet (0.1 mg total) by mouth at bedtime.  . divalproex (DEPAKOTE ER) 500 MG 24 hr tablet Take 500 mg by mouth 2 (two) times daily.  Marland Kitchen OMEPRAZOLE MAGNESIUM PO Take 40 mg by mouth every morning.   Marland Kitchen OVER THE COUNTER MEDICATION Take by mouth at bedtime. CBD OIL  . polyethylene glycol (MIRALAX / GLYCOLAX) packet Take 17 g by mouth 2 (two) times daily as needed for mild constipation.   . divalproex (DEPAKOTE ER) 250 MG 24 hr tablet TAKE 1 TABLET (250 MG TOTAL) BY MOUTH 2 (TWO) TIMES DAILY. (Patient not taking: Reported on 05/19/2018)    FAMILY HISTORY:  His family history includes Hypertension in his father and mother.  Family reports that his siblings are normal without any dyskinetic movements.  SOCIAL HISTORY: He  reports that he has never smoked. He has never used smokeless tobacco. He reports that he does not drink alcohol or use drugs.  REVIEW OF SYSTEMS:   At baseline he is  ambulatory and able to walk ad lib.  He is nonverbal.  There have been a very few occasions where his parents feel that he may have had episodes that were different from his usual episodes of involuntary movement which included loss of consciousness and collapsing.  These have been very rare.  He is not known ever to have had difficulties with aspiration during his episodes and is never previously been treated for pneumonia.  He has no known history of chest pain, arrhythmias, or heart murmurs.  There is no history of endocrine disorders specifically no thyroid disease or diabetes.   The remainder of the review of systems is not remarkable   SUBJECTIVE:  As above  VITAL SIGNS: BP 128/66   Pulse 79   Temp 97.7 F (36.5 C) (Core)   Resp (!) 24   Ht 5\' 10"  (1.778 m)   Wt 286 lb 13.1 oz (130.1 kg)   SpO2 100%   BMI 41.15 kg/m   HEMODYNAMICS:    VENTILATOR SETTINGS: Vent Mode: PRVC FiO2 (%):  [40 %] 40 % Set Rate:  [24 bmp] 24 bmp Vt Set:  [580 mL] 580 mL PEEP:  [5 cmH20] 5 cmH20 Plateau Pressure:  [18 cmH20-24 cmH20] 18 cmH20  INTAKE / OUTPUT: I/O last 3 completed shifts: In: 3368.4 [I.V.:1560.4; NG/GT:810; IV Piggyback:997.9] Out: 2870 [Urine:2670; Emesis/NG output:200]  PHYSICAL EXAMINATION: General: This is a somewhat obese young male who is orally intubated and mechanically ventilated. Neuro:His pupils are about 2mm and poorly reactive. He does not cough or gag or respond to noxious stimuli Cardiovascular: S1 and S2 are regular without murmur rub or gallop.  He does have symmetric 1/2+ lower extremity edema Lungs: Respirations are unlabored, there is symmetric air movement no wheezes  Abdomen: The abdomen is obese and soft without any organomegaly masses or tenderness, he is anicteric. Musculoskeletal: Limbs are pink and warm   LABS:  BMET Recent Labs  Lab 05/03/18 1610 05/04/18 0406 05/05/18 0432  NA 140 141 138  K 3.1* 2.9* 3.2*  CL 108 107 108  CO2 20* 25 21*  BUN 15 17 14   CREATININE 0.78 0.75 0.65  GLUCOSE 113* 131* 182*    Electrolytes Recent Labs  Lab 05/10/2018 1717  05/03/18 1610 05/04/18 0406 05/05/18 0432  CALCIUM 8.3*   < > 7.4* 7.7* 8.0*  MG 2.6*  --   --   --   --    < > = values in this interval not displayed.    CBC Recent Labs  Lab 04/29/2018 1717 05/05/18 0432  WBC 21.9* 15.8*  HGB 17.1* 14.2  HCT 53.2* 43.0  PLT 206 164    Coag's Recent Labs  Lab 04/14/2018 1717 05/01/2018 2105 05/03/18 0028  APTT  --  26  --   INR 1.20  --  1.10    Sepsis Markers Recent Labs  Lab 05/03/2018 1730  05/03/18 1000 05/03/18 1114 05/04/18 0406 05/05/18 0432  LATICACIDVEN 7.82* 5.7*  --   --   --   PROCALCITON  --   --  4.95 4.62 1.82    ABG Recent Labs  Lab 05/03/18 0437 05/03/18 1000 05/05/18 0503  PHART 7.317* 7.389 7.445  PCO2ART 34.2 30.2* 31.6*  PO2ART 99.0 101.0 192.0*    Liver Enzymes Recent Labs  Lab 04/19/2018 1717  AST 147*  ALT 154*  ALKPHOS 106  BILITOT 0.8  ALBUMIN 3.6    Cardiac Enzymes Recent Labs  Lab 05/03/18 0028 05/03/18 0654 05/03/18 1610  TROPONINI 1.80* 1.73* 1.16*    Glucose Recent Labs  Lab 05/04/18 1138 05/04/18 1632 05/04/18 2006 05/04/18 2329 05/05/18 0355 05/05/18 0731  GLUCAP 104* 128* 137* 184* 185* 148*    Imaging Dg Chest Port 1 View  Result Date: 05/05/2018 CLINICAL DATA:  Intubated EXAM: PORTABLE CHEST 1 VIEW COMPARISON:  04/19/2018 FINDINGS: Grossly unchanged cardiac silhouette and mediastinal contours. Stable positioning of support apparatus. Improved aeration of the lungs with minimal bilateral infrahilar opacities favored to represent atelectasis. No definite pleural effusion or pneumothorax. No evidence of edema. No acute osseous abnormalities. IMPRESSION: 1.  Stable positioning of support apparatus.  No pneumothorax. 2. Overall improved aeration of lungs with persistent bilateral infrahilar opacities, atelectasis versus infiltrate. Electronically Signed   By: Simonne ComeJohn  Watts M.D.   On: 05/05/2018 09:06     STUDIES:  Chest x-ray shows a well-placed endotracheal tube and central line without pneumothorax.  He has a right upper lobe infiltrate.  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  ASSESSMENT / PLAN:  PULMONARY A: Chest x-ray shows an overt infiltrate and a wide AA gradient is present.  I am treating this is aspiration  with Zosyn.  I suspect that this was a provocation for his arrest. CXR may show some minor densities in the lower lungs.  I don't see anything in the RUL. 02 requirements have not changed  CARDIOVASCULAR  The patient has a stable bp. He is not requiring pressors. He is in a NSR  RENAL  renal fn. And output are normal   GASTROINTESTINAL A: Prophylaxis is with Pepcid   HEMATOLOGIC A: VT prophylaxis is with subcu heparin.  I noticed that he has a substantial lymphocytosis.  This will need to be investigated if it persists.  INFECTIOUS A: Suspect aspiration.  Blood and sputum have been cultured.  Zosyn initiated.   ENDOCRINE A: He is glucose intolerant, this is not baseline for him.  Sliding scale insulin has been ordered   P:    NEUROLOGIC   Neurolgy was here earler and a had a prolonged discussion with his mother and family. They expressed that they wanted to observe the patient another day. His Versed has been off since early yesterday ( about 3PM) and Fentanyl at 10 PM. The patient does not move, open his eyes or withdraw from painful stimuli. When I placed him on CPAP he did take 1 or two breaths however. It does appear that he sustained a major anoxic injury. MRI is pending and may be helpful if it shows massive cerebral edeam or incipient henriation.     G  05/05/2018, 9:30 AM

## 2018-05-05 NOTE — Significant Event (Signed)
PCCM Interval Note   Called to bedside to discuss CTH findings with family.  Patient throughout the day with hypertensive requiring Cardene and labetalol gtt with pupillary changes of fixed/dilated R pupil and enlarging non-reactive left pupil therefore, MRI abated and went for stat CTH.  He has had absent corneal, gag, and cough reflexes, but has continued to intermittently breath over MV.  Last sedation off 7/22 around 2200.    Repeat CTH showing "Dramatic interval change since 3 days ago. There is now diffuse cerebral edema and increased intracranial pressure. 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 Amada JupiterKirkpatrick with neurology.  He has no further recommendations at this time, to continue supportive care.   Sat down with patient's mother and father, sister, brother and his wife to discuss all findings as stated above.  Reviewed prolonged cardiac arrest, > 45 mins downtime and that TTM protocol was a preventive and therapeutic measure, however after rewarming, ongoing assessments, removal of sedation, EEG with no evidence of seizure activity, it is apparent that he had unfortunate irreversible brain damage from his prolonged arrest with no change of meaningful or functional recovery, however does not meet brain death criteria for testing as he continues to breath over mechanical support.  CTH images on 7/20 shown in comparison to today's images.  This I think has helped family visually see changes.  We discussed moving forward and if patient was to decline how would they want to proceed forward.  Family reiterated that they would not want him in pain, although I do not feel that he shows any signs of pain, and that they know he would not want to live in this state compared to his active, happy, nonverbal health prior to this.  Mother seems to struggle with what caused his cardiac arrest, verbalizing seizure vs choking and  unfortunately, it is not clear at this time what happened and that we may never know. Additionally, she is very burdened in decision making, feeling like she is stopping or withdrawaling care and states she wants him (the patient), to make that decision.  I encouraged family to ask questions and allowed therapeutic listening.  After some time, the family made the decision in the event of cardiac arrest, we would not perform CPR or defib and if his blood pressure were to drop, the family does not want vasopressor support, but to be notified and they would perceive that as the patient's progression to have a natural death and most likely would at that time transition to comfort care and extubate.  Therefore, orders changed to reflect discussion of DNR with no escalation of care. RN present for most of dicussions and aware of changes.    Additional CCT > 60 mins  Posey BoyerBrooke Derel Mcglasson, AGACNP-BC [facility] Pgr: [phone] or if no answer [phone] 05/06/2018, 12:09 AM

## 2018-05-05 NOTE — Plan of Care (Signed)
Problem: Clinical Measurements: Goal: Ability to maintain clinical measurements within normal limits will improve Outcome: Progressing Goal: Cardiovascular complication will be avoided Outcome: Progressing   Problem: Nutrition: Goal: Adequate nutrition will be maintained Outcome: Progressing   Problem: Elimination: Goal: Will not experience complications related to bowel motility Outcome: Progressing Goal: Will not experience complications related to urinary retention Outcome: Progressing   

## 2018-05-05 NOTE — Progress Notes (Signed)
eLink Physician-Brief Progress Note Patient Name: Swazil[place]Jordan Fulp DOB: 02/21/1986 MRN: [id number]   Date of Service  05/05/2018  HPI/Events of Note  HTN, tachycardia, unresponsive  eICU Interventions  Labetalol drip ordered     Intervention Category Major Interventions: Hypertension - evaluation and management  YACOUB,WESAM 05/05/2018, 1:11 AM

## 2018-05-06 ENCOUNTER — Inpatient Hospital Stay (HOSPITAL_COMMUNITY): Payer: 59

## 2018-05-06 LAB — CBC
HCT: 40.2 % (ref 39.0–52.0)
Hemoglobin: 13 g/dL (ref 13.0–17.0)
MCH: 28.8 pg (ref 26.0–34.0)
MCHC: 32.3 g/dL (ref 30.0–36.0)
MCV: 88.9 fL (ref 78.0–100.0)
PLATELETS: 178 10*3/uL (ref 150–400)
RBC: 4.52 MIL/uL (ref 4.22–5.81)
RDW: 13.3 % (ref 11.5–15.5)
WBC: 17 10*3/uL — ABNORMAL HIGH (ref 4.0–10.5)

## 2018-05-06 LAB — BASIC METABOLIC PANEL
Anion gap: 7 (ref 5–15)
BUN: 14 mg/dL (ref 6–20)
CALCIUM: 8.6 mg/dL — AB (ref 8.9–10.3)
CHLORIDE: 112 mmol/L — AB (ref 98–111)
CO2: 23 mmol/L (ref 22–32)
Creatinine, Ser: 0.7 mg/dL (ref 0.61–1.24)
GFR calc non Af Amer: >60 mL/min (ref >60)
Glucose, Bld: 114 mg/dL — ABNORMAL HIGH (ref 70–99)
Potassium: 3.5 mmol/L (ref 3.5–5.1)
SODIUM: 142 mmol/L (ref 135–145)

## 2018-05-06 LAB — COMPREHENSIVE METABOLIC PANEL
ALT: 33 U/L (ref 0–44)
ANION GAP: 8 (ref 5–15)
AST: 58 U/L — ABNORMAL HIGH (ref 15–41)
Albumin: 2.8 g/dL — ABNORMAL LOW (ref 3.5–5.0)
Alkaline Phosphatase: 55 U/L (ref 38–126)
BUN: 18 mg/dL (ref 6–20)
CHLORIDE: 116 mmol/L — AB (ref 98–111)
CO2: 21 mmol/L — AB (ref 22–32)
Calcium: 8.7 mg/dL — ABNORMAL LOW (ref 8.9–10.3)
Creatinine, Ser: 0.62 mg/dL (ref 0.61–1.24)
GFR calc non Af Amer: >60 mL/min (ref >60)
Glucose, Bld: 111 mg/dL — ABNORMAL HIGH (ref 70–99)
POTASSIUM: 3.7 mmol/L (ref 3.5–5.1)
SODIUM: 145 mmol/L (ref 135–145)
Total Bilirubin: 0.6 mg/dL (ref 0.3–1.2)
Total Protein: 5.9 g/dL — ABNORMAL LOW (ref 6.5–8.1)

## 2018-05-06 LAB — URINALYSIS, ROUTINE W REFLEX MICROSCOPIC
BILIRUBIN URINE: NEGATIVE
GLUCOSE, UA: NEGATIVE mg/dL
Hgb urine dipstick: NEGATIVE
Ketones, ur: NEGATIVE mg/dL
LEUKOCYTES UA: NEGATIVE
Nitrite: NEGATIVE
PROTEIN: NEGATIVE mg/dL
SPECIFIC GRAVITY, URINE: 1.028 (ref 1.005–1.030)
pH: 6 (ref 5.0–8.0)

## 2018-05-06 LAB — GLUCOSE, CAPILLARY
GLUCOSE-CAPILLARY: 104 mg/dL — AB (ref 70–99)
GLUCOSE-CAPILLARY: 131 mg/dL — AB (ref 70–99)
GLUCOSE-CAPILLARY: 114 mg/dL — AB (ref 70–99)
GLUCOSE-CAPILLARY: 94 mg/dL (ref 70–99)
Glucose-Capillary: 103 mg/dL — ABNORMAL HIGH (ref 70–99)
Glucose-Capillary: 152 mg/dL — ABNORMAL HIGH (ref 70–99)

## 2018-05-06 LAB — CBC WITH DIFFERENTIAL/PLATELET
Abs Immature Granulocytes: 0.3 10*3/uL — ABNORMAL HIGH (ref 0.0–0.1)
Basophils Absolute: 0.1 10*3/uL (ref 0.0–0.1)
Basophils Relative: 1 %
EOS ABS: 0 10*3/uL (ref 0.0–0.7)
EOS PCT: 0 %
HCT: 40 % (ref 39.0–52.0)
HEMOGLOBIN: 13 g/dL (ref 13.0–17.0)
Immature Granulocytes: 2 %
LYMPHS PCT: 18 %
Lymphs Abs: 2.5 10*3/uL (ref 0.7–4.0)
MCH: 29.5 pg (ref 26.0–34.0)
MCHC: 32.5 g/dL (ref 30.0–36.0)
MCV: 90.7 fL (ref 78.0–100.0)
MONO ABS: 1.8 10*3/uL — AB (ref 0.1–1.0)
Monocytes Relative: 13 %
Neutro Abs: 9.4 10*3/uL — ABNORMAL HIGH (ref 1.7–7.7)
Neutrophils Relative %: 66 %
Platelets: 161 10*3/uL (ref 150–400)
RBC: 4.41 MIL/uL (ref 4.22–5.81)
RDW: 13.3 % (ref 11.5–15.5)
WBC: 14.2 10*3/uL — ABNORMAL HIGH (ref 4.0–10.5)

## 2018-05-06 LAB — POCT I-STAT 3, ART BLOOD GAS (G3+)
Acid-Base Excess: 1 mmol/L (ref 0.0–2.0)
Acid-base deficit: 3 mmol/L — ABNORMAL HIGH (ref 0.0–2.0)
BICARBONATE: 21.6 mmol/L (ref 20.0–28.0)
Bicarbonate: 21 mmol/L (ref 20.0–28.0)
O2 Saturation: 100 %
O2 Saturation: 100 %
PCO2 ART: 23.9 mmHg — AB (ref 32.0–48.0)
PCO2 ART: 32.6 mmHg (ref 32.0–48.0)
PH ART: 7.564 — AB (ref 7.350–7.450)
PH ART: 7.416 (ref 7.350–7.450)
PO2 ART: 179 mmHg — AB (ref 83.0–108.0)
PO2 ART: 168 mmHg — AB (ref 83.0–108.0)
Patient temperature: 37.1
TCO2: 22 mmol/L (ref 22–32)
TCO2: 22 mmol/L (ref 22–32)

## 2018-05-06 LAB — BILIRUBIN, DIRECT: Bilirubin, Direct: 0.1 mg/dL (ref 0.0–0.2)

## 2018-05-06 LAB — LACTATE DEHYDROGENASE: LDH: 373 U/L — AB (ref 98–192)

## 2018-05-06 LAB — PROTIME-INR
INR: 1.07
PROTHROMBIN TIME: 13.8 s (ref 11.4–15.2)

## 2018-05-06 LAB — TYPE AND SCREEN
ABO/RH(D): B POS
ABO/RH(D): B POS
Antibody Screen: NEGATIVE
Antibody Screen: NEGATIVE

## 2018-05-06 LAB — GAMMA GT: GGT: 45 U/L (ref 7–50)

## 2018-05-06 LAB — MAGNESIUM: Magnesium: 2.5 mg/dL — ABNORMAL HIGH (ref 1.7–2.4)

## 2018-05-06 LAB — APTT: aPTT: 26 seconds (ref 24–36)

## 2018-05-06 LAB — PHOSPHORUS: PHOSPHORUS: 2.5 mg/dL (ref 2.5–4.6)

## 2018-05-06 MED ORDER — MEPERIDINE HCL 25 MG/ML IJ SOLN: 25.0000 mg | INTRAMUSCULAR | Status: DC | PRN

## 2018-05-06 MED ORDER — SODIUM CHLORIDE 0.9 % IV BOLUS
1000.0000 mL | Freq: Once | INTRAVENOUS | Status: AC
  Administered 2018-05-06: 1000 mL via INTRAVENOUS

## 2018-05-06 MED ORDER — POTASSIUM CHLORIDE 20 MEQ/15ML (10%) PO SOLN
20.0000 meq | ORAL | Status: AC
  Administered 2018-05-06 (×2): 20 meq
  Filled 2018-05-06 (×2): qty 15

## 2018-05-06 MED ORDER — LACTATED RINGERS IV SOLN
INTRAVENOUS | Status: DC
  Administered 2018-05-06 – 2018-05-07 (×3): via INTRAVENOUS

## 2018-05-06 MED ORDER — HYPROMELLOSE (GONIOSCOPIC) 2.5 % OP SOLN
2.0000 [drp] | Freq: Four times a day (QID) | OPHTHALMIC | Status: DC
  Filled 2018-05-06: qty 15

## 2018-05-06 NOTE — Procedures (Signed)
The patient required a new arterial line. He needs a good deal of blood work and accurate bp.the patient is slated for DCD tomorrow.  Consent was obtained from his parents. The patient had had an arterial line until yesterday. Both respiratory and I attempted to place the line in the right radial position. The patient had a bounding pulse there but for some reason Neither they nor I was able to cannulate it. We than cleansed and shaved the right inguinal atrea. We were able to cannulate the right femoral artery and the line was placed and sutured inplace. We were able to obtain a good pulsating blood returnand good arterial waveform thereafter. The line was sutured in place.

## 2018-05-06 NOTE — Progress Notes (Signed)
.  [facility] FOR AM LAB REPLACEMENT ONLY  The [facility] does [facility] based on the criteria listed below:   1. Is GFR >/= 40 ml/min? Yes.    Patient's GFR today is >60 2. Is urine output >/= 0.5 ml/kg/hr for the last 6 hours? Yes.   Patient's UOP is 1.4 ml/kg/hr 3. Is BUN < 60 mg/dL? Yes.    Patient's BUN today is 14 4. Abnormal electrolyte(s):  3.5 K 5. Ordered repletion with: per  protocol 6. If a panic level lab has been reported, has the CCM MD in charge been notified? Yes.  .   Physician:  Dr. Roxy Mannseterding  Jay Krueger, Jay Krueger 05/06/2018 5:07 AM

## 2018-05-06 NOTE — Progress Notes (Signed)
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  The disorder is manifested as involuntary limb movements and formations which can last anywhere from 30 minutes to 10 hours and during which his mental status does not significantly change.  He was in his usual state of health this morning with no indications of a superimposed acute illness, no chest pains no fevers no chills no sweats no coughs.  He was eating some gummy bears when last seen by his caretaker.  When she returned to the room he was unresponsive and she felt he was pulseless.  EMS was called and he was treated for PEA followed by asystole with 45 minutes of ACLS. ROSC was achieved, and he is now in the emergency room on no pressors intubated and mechanically ventilated.  He is sedated on 10 mcg of propofol at the time of my examination.  7/22 The patient remains on the ventilator. He remains on a versed infusion at 4 mcg/hr and of Fentanyl. He has finished rewarming. He is not moving presently. Does not withdraw to painful stimuli. EEG was completetd this AM. I have asked neurolgy to see the patient for prognostic purposes.  7/23 The patient remains on the ventialtor. He was just seen by neurology. He does nit move. His cough and gag are not existent. He does have DTRs in the arms and legs. His EEG was a very suppressed recording. When I placed thepatient oon CPAP he did trtake an occasional breath. MRI is scheduled for today. Neurology spoke to the family. They want to observe thepatient another day off sedation. We are all of the mind that the prognosis for neurolgical recovery is poor at this time.  7/24 The patient had his Stat CT  scan showing signs of massive cerebral edema and some herniation of the cerebellar tonsils. The patient is now DNR. He will not likely survive more than another 24-48 hours. His left pupil is dow dialted to about as well. The family  Is at the bedside. They understand the grave nature of these findings. No escalationopf care at this point.Marland Kitchen   PAST MEDICAL HISTORY :  He  has a past medical history of Mental retardation and Seizure (HCC).  PAST SURGICAL HISTORY: He  has a past surgical history that includes Hernia repair.  No Known Allergies  No current facility-administered medications on file prior to encounter.    Current Outpatient Medications on File Prior to Encounter  Medication Sig  . ALPRAZolam (XANAX) 0.5 MG tablet Take 0.5 mg by mouth 3 (three) times daily.  . cloNIDine (CATAPRES) 0.1 MG tablet Take 1 tablet (0.1 mg total) by mouth at bedtime.  . divalproex (DEPAKOTE ER) 500 MG 24 hr tablet Take 500 mg by mouth 2 (two) times daily.  Marland Kitchen OMEPRAZOLE MAGNESIUM PO Take 40 mg by mouth every morning.   Marland Kitchen OVER THE COUNTER MEDICATION Take by mouth at bedtime. CBD OIL  . polyethylene glycol (MIRALAX / GLYCOLAX) packet Take 17 g by mouth 2 (two) times daily as needed for mild constipation.   . divalproex (DEPAKOTE ER) 250 MG 24 hr tablet TAKE 1 TABLET (250 MG TOTAL) BY MOUTH 2 (TWO) TIMES DAILY. (Patient not  taking: Reported on 05/01/2018)    FAMILY HISTORY:  His family history includes Hypertension in his father and mother.  Family reports that his siblings are normal without any dyskinetic movements.  SOCIAL HISTORY: He  reports that he has never smoked. He has never used smokeless tobacco. He reports that he does not drink alcohol or use drugs.  REVIEW OF SYSTEMS:   At baseline he is ambulatory and able to walk ad lib.  He is nonverbal.  There have been a very few occasions where his parents feel that he may have had episodes that were different from his usual episodes of  involuntary movement which included loss of consciousness and collapsing.  These have been very rare.  He is not known ever to have had difficulties with aspiration during his episodes and is never previously been treated for pneumonia.  He has no known history of chest pain, arrhythmias, or heart murmurs.  There is no history of endocrine disorders specifically no thyroid disease or diabetes.  The remainder of the review of systems is not remarkable   SUBJECTIVE:  As above  VITAL SIGNS: BP (!) 130/59   Pulse 95   Temp 99.7 F (37.6 C) (Core)   Resp 17   Ht 5\' 10"  (1.778 m)   Wt 277 lb 5.4 oz (125.8 kg)   SpO2 98%   BMI 39.79 kg/m   HEMODYNAMICS:    VENTILATOR SETTINGS: Vent Mode: PRVC FiO2 (%):  [40 %] 40 % Set Rate:  [20 bmp] 20 bmp Vt Set:  [550 mL] 550 mL PEEP:  [5 cmH20] 5 cmH20 Plateau Pressure:  [13 cmH20-22 cmH20] 22 cmH20  INTAKE / OUTPUT: I/O last 3 completed shifts: In: 3918.7 [I.V.:1555; NG/GT:1760; IV Piggyback:603.6] Out: 4098 [Urine:5512; Emesis/NG output:500]  PHYSICAL EXAMINATION: General: This is a somewhat obese young male who is orally intubated and mechanically ventilated. Neuro:His pupils are about 2mm and poorly reactive. He does not cough or gag or respond to noxious stimuli Cardiovascular: S1 and S2 are regular without murmur rub or gallop.  He does have symmetric 1/2+ lower extremity edema Lungs: Respirations are unlabored, there is symmetric air movement no wheezes  Abdomen: The abdomen is obese and soft without any organomegaly masses or tenderness, he is anicteric. Musculoskeletal: Limbs are pink and warm   LABS:  BMET Recent Labs  Lab 05/05/18 0432 05/05/18 1234 05/06/18 0319  NA 138 139 142  K 3.2* 3.4* 3.5  CL 108 108 112*  CO2 21* 24 23  BUN 14 15 14   CREATININE 0.65 0.65 0.70  GLUCOSE 182* 134* 114*    Electrolytes Recent Labs  Lab 05/05/2018 1717  05/05/18 0432 05/05/18 1234 05/06/18 0319  CALCIUM 8.3*   < > 8.0* 8.4*  8.6*  MG 2.6*  --   --   --   --    < > = values in this interval not displayed.    CBC Recent Labs  Lab 04/20/2018 1717 05/05/18 0432 05/06/18 0319  WBC 21.9* 15.8* 17.0*  HGB 17.1* 14.2 13.0  HCT 53.2* 43.0 40.2  PLT 206 164 178    Coag's Recent Labs  Lab 05/06/2018 1717 05/10/2018 2105 05/03/18 0028  APTT  --  26  --   INR 1.20  --  1.10    Sepsis Markers Recent Labs  Lab 05/03/2018 1730 05/03/18 1000 05/03/18 1114 05/04/18 0406 05/05/18 0432  LATICACIDVEN 7.82* 5.7*  --   --   --   PROCALCITON  --   --  4.95 4.62 1.82    ABG Recent Labs  Lab 05/03/18 0437 05/03/18 1000 05/05/18 0503  PHART 7.317* 7.389 7.445  PCO2ART 34.2 30.2* 31.6*  PO2ART 99.0 101.0 192.0*    Liver Enzymes Recent Labs  Lab 05/01/2018 1717 05/05/18 1234  AST 147* 100*  ALT 154* 51*  ALKPHOS 106 59  BILITOT 0.8 0.6  ALBUMIN 3.6 3.1*    Cardiac Enzymes Recent Labs  Lab 05/03/18 0028 05/03/18 0654 05/03/18 1610  TROPONINI 1.80* 1.73* 1.16*    Glucose Recent Labs  Lab 05/05/18 1214 05/05/18 1531 05/05/18 1952 05/05/18 2334 05/06/18 0327 05/06/18 0820  GLUCAP 137* 137* 126* 152* 104* 131*    Imaging Ct Head Wo Contrast  Result Date: 05/05/2018 CLINICAL DATA:  New anisocoria with blown right pupil. Altered level of consciousness. EXAM: CT HEAD WITHOUT CONTRAST TECHNIQUE: Contiguous axial images were obtained from the base of the skull through the vertex without intravenous contrast. COMPARISON:  CT head 04/17/2018 FINDINGS: Brain: Interval development of severe diffuse cerebral edema. Loss of gray-white differentiation with effacement of the sulci bilaterally. Effacement of the basilar cisterns. Mild ventricular enlargement. Mild downward herniation of the cerebellar tonsils bilaterally. Effacement of the arachnoid cyst on the left due to increased intracranial pressure. Hypodensity in the putamen and caudate bilaterally has developed since the prior study due to acute  infarction. Negative for hemorrhage. No midline shift. Vascular: Hyperdensity in the mid sylvian fissure bilaterally is felt to be due to middle cerebral arteries. Diffuse vascular density is pronounced due to cerebral edema. Skull: Negative Sinuses/Orbits: Mucosal edema and air-fluid levels throughout the paranasal sinuses. Normal orbit. Other: None IMPRESSION: Dramatic interval change since 3 days ago. There is now diffuse cerebral edema and increased intracranial pressure. 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 Sinusitis with air-fluid levels. These results were called by telephone at the time of interpretation on 05/05/2018 at 8:50 pm to Dr. Lanora ManisElizabeth Dederding, who verbally acknowledged these results. Electronically Signed   By: Marlan Palauharles  Clark M.D.   On: 05/05/2018 20:51   Dg Chest Port 1 View  Result Date: 05/05/2018 CLINICAL DATA:  Intubated EXAM: PORTABLE CHEST 1 VIEW COMPARISON:  04/24/2018 FINDINGS: Grossly unchanged cardiac silhouette and mediastinal contours. Stable positioning of support apparatus. Improved aeration of the lungs with minimal bilateral infrahilar opacities favored to represent atelectasis. No definite pleural effusion or pneumothorax. No evidence of edema. No acute osseous abnormalities. IMPRESSION: 1.  Stable positioning of support apparatus.  No pneumothorax. 2. Overall improved aeration of lungs with persistent bilateral infrahilar opacities, atelectasis versus infiltrate. Electronically Signed   By: Simonne ComeJohn  Watts M.D.   On: 05/05/2018 09:06     STUDIES:  Chest x-ray shows a well-placed endotracheal tube and central line without pneumothorax.  He has a right upper lobe infiltrate.  CULTURES: Sputum and blood are pending  ANTIBIOTICS: Zosyn     LINES/TUBES: ET  Tube  OGT Left PICC line   DISCUSSION: 32 year old status post out of hospital cardiac arrest with undefined dyskinetic disorder at  baseline.  ASSESSMENT / PLAN:  PULMONARY A: Chest x-ray shows an overt infiltrate and a wide AA gradient is present.  I am treating this is aspiration with Zosyn.  I suspect that this was a provocation for his arrest. CXR may show some minor densities in the lower lungs.  I don't see anything in the RUL. 02 requirements have not changed. No real change isn respiratory staus  CARDIOVASCULAR  The patient has a  stable bp. He is not requiring pressors. He is in a NSR. No change hemodynamically  RENAL  renal fn. And output are normal   GASTROINTESTINAL A: Prophylaxis is with Pepcid   HEMATOLOGIC A: VT prophylaxis is with subcu heparin.  I noticed that he has a substantial lymphocytosis.  This will need to be investigated if it persists.  INFECTIOUS A: Suspect aspiration.  Blood and sputum have been cultured.  Zosyn initiated.   ENDOCRINE A: He is glucose intolerant, this is not baseline for him.  Sliding scale insulin has been ordered   P:    NEUROLOGIC   Neurolgy was here earler and a had a prolonged discussion with his mother and family. They expressed that they wanted to observe the patient another day. His Versed has been off since early yesterday ( about 3PM) and Fentanyl at 10 PM. The patient does not move, open his eyes or withdraw from painful stimuli. The patient has no brain stem reflexes including oculovestibular, corneal, cough or gag. He continues to breath over the ventialtor. 05/06/2018, 8:49 AM

## 2018-05-06 NOTE — Progress Notes (Signed)
Reason for consult: Anoxic injury   Subjective: Overnight patient's right eye pupil enlarged and was fixed and dilated.  CT head showed  diffuse cerebral edema   ROS:  Unable to obtain due to poor mental status  Examination  Vital signs in last 24 hours: Temp:  [98.2 F (36.8 C)-99.7 F (37.6 C)] 99.7 F (37.6 C) (07/24 1147) Pulse Rate:  [79-96] 79 (07/24 1147) Resp:  [13-28] 22 (07/24 1147) BP: (120-167)/(56-91) 139/68 (07/24 1147) SpO2:  [97 %-99 %] 98 % (07/24 1147) FiO2 (%):  [40 %] 40 % (07/24 1147) Weight:  [125.8 kg (277 lb 5.4 oz)] 125.8 kg (277 lb 5.4 oz) (07/24 0315)  General: Not in distress, cooperative CVS: pulse-normal rate and rhythm RS: breathing comfortably Extremities: normal   Neuro: Mental Status: Patient does not respond to verbal stimuli.  Does not respond to deep sternal rub.  Does not follow commands.  No verbalizations noted.  Cranial Nerves: II: patient does not respond confrontation bilaterally, pupils right 8 mm, left 6 mm III,IV,VI: doll's response absent bilaterally. Cold caloric responses not assessed V,VII: corneal reflex: absent bilaterally VIII: patient does not respond to verbal stimuli IX,X: gag reflex absent XI: trapezius strength unable to test bilaterally XII: tongue strength unable to test Motor: Extremities flaccid throughout.  No spontaneous movement noted.  No purposeful movements noted.  Sensory: Does not respond to noxious stimuli in any extremity Plantars: mute  Cerebellar: Unable to perform  Basic Metabolic Panel: Recent Labs  Lab 01-31-2018 1717  05/03/18 1610 05/04/18 0406 05/05/18 0432 05/05/18 1234 05/06/18 0319  NA 140   < > 140 141 138 139 142  K 5.1   < > 3.1* 2.9* 3.2* 3.4* 3.5  CL 106   < > 108 107 108 108 112*  CO2 18*   < > 20* 25 21* 24 23  GLUCOSE 240*   < > 113* 131* 182* 134* 114*  BUN 14   < > 15 17 14 15 14   CREATININE 1.53*   < > 0.78 0.75 0.65 0.65 0.70  CALCIUM 8.3*   < > 7.4* 7.7* 8.0* 8.4*  8.6*  MG 2.6*  --   --   --   --   --   --    < > = values in this interval not displayed.    CBC: Recent Labs  Lab 01-31-2018 1717 05/05/18 0432 05/06/18 0319 05/06/18 1417  WBC 21.9* 15.8* 17.0* 14.2*  NEUTROABS 11.8*  --   --  9.4*  HGB 17.1* 14.2 13.0 13.0  HCT 53.2* 43.0 40.2 40.0  MCV 89.9 87.4 88.9 90.7  PLT 206 164 178 161     Coagulation Studies: Recent Labs    05/06/18 1417  LABPROT 13.8  INR 1.07    Imaging Reviewed:     ASSESSMENT AND PLAN  32 year old male status post cardiac arrest with unknown downtime and 45 minutes of CPR before ROSC.  Patient did under go the hypothermia  protocol and now has been rewarmed about approximately 30 hours ago.   Patient continues to have very poor exam, worsened from yesterday.  CT head shows severe anoxic injury with diffuse cerebral edema and early herniation.  Hypodensity in basal ganglia bilaterally.  Given patient's poor neurological exam absent brainstem reflexes a lot of sedation in addition to diffuse cerebral edema on CT scan-patient will likely have a very poor prognosis.  Explained to them that patient will not have meaningful recovery.  Spent 35 min with patient  including more than half the time counseling family regarding poor neurological prognosis.   Neurology will sign off.   Georgiana Spinner Aroor [facility] Pager Number [phone] For questions after 7pm please refer to AMION to reach the Neurologist on call

## 2018-05-06 NOTE — Progress Notes (Signed)
Arterial line attempted by 2 RT's with unsuccessful attempts.  RN aware and stated she would notify MD.

## 2018-05-07 ENCOUNTER — Encounter (HOSPITAL_COMMUNITY): Payer: Self-pay | Admitting: Certified Registered"

## 2018-05-07 ENCOUNTER — Encounter (HOSPITAL_COMMUNITY): Admission: EM | Disposition: E | Payer: Self-pay | Source: Home / Self Care | Attending: Pulmonary Disease

## 2018-05-07 HISTORY — PX: ORGAN PROCUREMENT: SHX5270

## 2018-05-07 LAB — GLUCOSE, CAPILLARY
GLUCOSE-CAPILLARY: 95 mg/dL (ref 70–99)
Glucose-Capillary: 94 mg/dL (ref 70–99)
Glucose-Capillary: 88 mg/dL (ref 70–99)

## 2018-05-07 LAB — CBC WITH DIFFERENTIAL/PLATELET
Abs Immature Granulocytes: 0.5 10*3/uL — ABNORMAL HIGH (ref 0.0–0.1)
BASOS PCT: 1 %
Basophils Absolute: 0.1 10*3/uL (ref 0.0–0.1)
EOS ABS: 0.1 10*3/uL (ref 0.0–0.7)
Eosinophils Relative: 1 %
HEMATOCRIT: 36.2 % — AB (ref 39.0–52.0)
Hemoglobin: 11.4 g/dL — ABNORMAL LOW (ref 13.0–17.0)
IMMATURE GRANULOCYTES: 4 %
Lymphocytes Relative: 26 %
Lymphs Abs: 2.7 10*3/uL (ref 0.7–4.0)
MCH: 28.8 pg (ref 26.0–34.0)
MCHC: 31.5 g/dL (ref 30.0–36.0)
MCV: 91.4 fL (ref 78.0–100.0)
MONOS PCT: 14 %
Monocytes Absolute: 1.5 10*3/uL — ABNORMAL HIGH (ref 0.1–1.0)
NEUTROS PCT: 54 %
Neutro Abs: 5.8 10*3/uL (ref 1.7–7.7)
Platelets: 156 10*3/uL (ref 150–400)
RBC: 3.96 MIL/uL — AB (ref 4.22–5.81)
RDW: 14 % (ref 11.5–15.5)
WBC: 10.6 10*3/uL — AB (ref 4.0–10.5)

## 2018-05-07 LAB — COMPREHENSIVE METABOLIC PANEL
ALK PHOS: 49 U/L (ref 38–126)
ALT: 26 U/L (ref 0–44)
AST: 32 U/L (ref 15–41)
Albumin: 2.5 g/dL — ABNORMAL LOW (ref 3.5–5.0)
Anion gap: 7 (ref 5–15)
BUN: 22 mg/dL — AB (ref 6–20)
CALCIUM: 8.1 mg/dL — AB (ref 8.9–10.3)
CO2: 22 mmol/L (ref 22–32)
CREATININE: 0.84 mg/dL (ref 0.61–1.24)
Chloride: 124 mmol/L — ABNORMAL HIGH (ref 98–111)
GFR calc non Af Amer: >60 mL/min (ref >60)
Glucose, Bld: 96 mg/dL (ref 70–99)
Potassium: 3.3 mmol/L — ABNORMAL LOW (ref 3.5–5.1)
Sodium: 153 mmol/L — ABNORMAL HIGH (ref 135–145)
Total Bilirubin: 0.4 mg/dL (ref 0.3–1.2)
Total Protein: 5.3 g/dL — ABNORMAL LOW (ref 6.5–8.1)

## 2018-05-07 LAB — CBC
HCT: 37.5 % — ABNORMAL LOW (ref 39.0–52.0)
Hemoglobin: 11.6 g/dL — ABNORMAL LOW (ref 13.0–17.0)
MCH: 28 pg (ref 26.0–34.0)
MCHC: 30.9 g/dL (ref 30.0–36.0)
MCV: 90.6 fL (ref 78.0–100.0)
PLATELETS: 174 10*3/uL (ref 150–400)
RBC: 4.14 MIL/uL — AB (ref 4.22–5.81)
RDW: 13.8 % (ref 11.5–15.5)
WBC: 11.3 10*3/uL — ABNORMAL HIGH (ref 4.0–10.5)

## 2018-05-07 LAB — BLOOD CULTURE ID PANEL (REFLEXED)
Acinetobacter baumannii: NOT DETECTED
CANDIDA ALBICANS: NOT DETECTED
CANDIDA GLABRATA: NOT DETECTED
CANDIDA PARAPSILOSIS: NOT DETECTED
CANDIDA TROPICALIS: NOT DETECTED
Candida krusei: NOT DETECTED
ENTEROBACTER CLOACAE COMPLEX: NOT DETECTED
ENTEROBACTERIACEAE SPECIES: NOT DETECTED
Enterococcus species: NOT DETECTED
Escherichia coli: NOT DETECTED
Haemophilus influenzae: NOT DETECTED
KLEBSIELLA OXYTOCA: NOT DETECTED
KLEBSIELLA PNEUMONIAE: NOT DETECTED
Listeria monocytogenes: NOT DETECTED
Methicillin resistance: NOT DETECTED
NEISSERIA MENINGITIDIS: NOT DETECTED
PROTEUS SPECIES: NOT DETECTED
Pseudomonas aeruginosa: NOT DETECTED
STAPHYLOCOCCUS SPECIES: DETECTED — AB
STREPTOCOCCUS PYOGENES: NOT DETECTED
STREPTOCOCCUS SPECIES: NOT DETECTED
Serratia marcescens: NOT DETECTED
Staphylococcus aureus (BCID): NOT DETECTED
Streptococcus agalactiae: NOT DETECTED
Streptococcus pneumoniae: NOT DETECTED

## 2018-05-07 LAB — URINALYSIS, ROUTINE W REFLEX MICROSCOPIC
Bilirubin Urine: NEGATIVE
Glucose, UA: NEGATIVE mg/dL
HGB URINE DIPSTICK: NEGATIVE
Ketones, ur: NEGATIVE mg/dL
Leukocytes, UA: NEGATIVE
Nitrite: NEGATIVE
PH: 5 (ref 5.0–8.0)
Protein, ur: NEGATIVE mg/dL
Specific Gravity, Urine: 1.026 (ref 1.005–1.030)

## 2018-05-07 LAB — BASIC METABOLIC PANEL
Anion gap: 7 (ref 5–15)
BUN: 18 mg/dL (ref 6–20)
CALCIUM: 8.4 mg/dL — AB (ref 8.9–10.3)
CO2: 24 mmol/L (ref 22–32)
CREATININE: 0.76 mg/dL (ref 0.61–1.24)
Chloride: 120 mmol/L — ABNORMAL HIGH (ref 98–111)
GFR calc non Af Amer: >60 mL/min (ref >60)
Glucose, Bld: 110 mg/dL — ABNORMAL HIGH (ref 70–99)
Potassium: 3.9 mmol/L (ref 3.5–5.1)
SODIUM: 151 mmol/L — AB (ref 135–145)

## 2018-05-07 LAB — URINE CULTURE: CULTURE: NO GROWTH

## 2018-05-07 LAB — CULTURE, BLOOD (ROUTINE X 2)
CULTURE: NO GROWTH
Culture: NO GROWTH
SPECIAL REQUESTS: ADEQUATE
SPECIAL REQUESTS: ADEQUATE

## 2018-05-07 LAB — PROTIME-INR
INR: 1.11
Prothrombin Time: 14.2 seconds (ref 11.4–15.2)

## 2018-05-07 LAB — APTT: APTT: 29 s (ref 24–36)

## 2018-05-07 SURGERY — SURGICAL PROCUREMENT, ORGAN
Anesthesia: Choice | Site: Abdomen

## 2018-05-07 MED ORDER — HEPARIN SODIUM (PORCINE) 1000 UNIT/ML IJ SOLN
12500.0000 [IU] | Freq: Once | INTRAMUSCULAR | Status: DC
  Filled 2018-05-07: qty 12.5

## 2018-05-07 MED ORDER — 0.9 % SODIUM CHLORIDE (POUR BTL) OPTIME: TOPICAL | Status: DC | PRN

## 2018-05-07 MED ORDER — SODIUM CHLORIDE 0.9 % IV SOLN
0.5000 [IU]/h | INTRAVENOUS | Status: DC
  Administered 2018-05-07: 0.5 [IU]/h via INTRAVENOUS
  Filled 2018-05-07: qty 1

## 2018-05-07 MED ORDER — 0.9 % SODIUM CHLORIDE (POUR BTL) OPTIME
TOPICAL | Status: DC | PRN
  Administered 2018-05-07: 1000 mL
  Administered 2018-05-07: 2000 mL

## 2018-05-07 MED ORDER — FENTANYL CITRATE (PF) 100 MCG/2ML IJ SOLN: 25.0000 ug | INTRAMUSCULAR | Status: DC | PRN

## 2018-05-07 SURGICAL SUPPLY — 74 items
BLADE 10 SAFETY STRL DISP (BLADE) IMPLANT
BLADE CLIPPER SURG (BLADE) ×3 IMPLANT
BLADE SURG 10 STRL SS (BLADE) IMPLANT
CANISTER SUCTION 2500CC (MISCELLANEOUS) ×12 IMPLANT
CANNULA VESSEL W/WING WO/VALVE (CANNULA) IMPLANT
CLIP VESOCCLUDE MED 24/CT (CLIP) IMPLANT
CLIP VESOCCLUDE SM WIDE 24/CT (CLIP) IMPLANT
CONT SPEC 4OZ CLIKSEAL STRL BL (MISCELLANEOUS) ×6 IMPLANT
COVER BACK TABLE 60X90IN (DRAPES) IMPLANT
COVER MAYO STAND STRL (DRAPES) IMPLANT
COVER SURGICAL LIGHT HANDLE (MISCELLANEOUS) IMPLANT
DRAPE HALF SHEET 40X57 (DRAPES) IMPLANT
DRAPE SLUSH MACHINE 52X66 (DRAPES) ×3 IMPLANT
DRAPE SLUSH/WARMER DISC (DRAPES) ×3 IMPLANT
DRSG COVADERM 4X10 (GAUZE/BANDAGES/DRESSINGS) ×3 IMPLANT
DRSG COVADERM 4X14 (GAUZE/BANDAGES/DRESSINGS) ×3 IMPLANT
DRSG TELFA 3X8 NADH (GAUZE/BANDAGES/DRESSINGS) ×3 IMPLANT
DURAPREP 26ML APPLICATOR (WOUND CARE) IMPLANT
ELECT BLADE 6.5 EXT (BLADE) IMPLANT
ELECT REM PT RETURN 9FT ADLT (ELECTROSURGICAL)
ELECTRODE REM PT RTRN 9FT ADLT (ELECTROSURGICAL) IMPLANT
GAUZE SPONGE 4X4 16PLY XRAY LF (GAUZE/BANDAGES/DRESSINGS) IMPLANT
GLOVE BIOGEL PI IND STRL 7.5 (GLOVE) ×1 IMPLANT
GLOVE BIOGEL PI INDICATOR 7.5 (GLOVE) ×2
GLOVE SURG SS PI 6.5 STRL IVOR (GLOVE) ×15 IMPLANT
GLOVE SURG SS PI 7.0 STRL IVOR (GLOVE) ×3 IMPLANT
GLOVE SURG SS PI 7.5 STRL IVOR (GLOVE) ×15 IMPLANT
GOWN STRL REUS W/ TWL LRG LVL3 (GOWN DISPOSABLE) ×7 IMPLANT
GOWN STRL REUS W/TWL LRG LVL3 (GOWN DISPOSABLE) ×21
HANDLE SUCTION POOLE (INSTRUMENTS) ×1 IMPLANT
IV SODIUM CHL 0.9 SLUSH (IV SOLUTION) ×3 IMPLANT
KIT BASIN OR (CUSTOM PROCEDURE TRAY) ×3 IMPLANT
KIT POST MORTEM ADULT 36X90 (BAG) IMPLANT
KIT TURNOVER KIT B (KITS) ×3 IMPLANT
LOOP VESSEL MAXI BLUE (MISCELLANEOUS) IMPLANT
LOOP VESSEL MINI RED (MISCELLANEOUS) IMPLANT
MANIFOLD NEPTUNE II (INSTRUMENTS) IMPLANT
NEEDLE BIOPSY 14X6 SOFT TISS (NEEDLE) IMPLANT
NS IRRIG 1000ML POUR BTL (IV SOLUTION) ×9 IMPLANT
PACK AORTA (CUSTOM PROCEDURE TRAY) ×3 IMPLANT
PAD ARMBOARD 7.5X6 YLW CONV (MISCELLANEOUS) ×6 IMPLANT
PENCIL BUTTON HOLSTER BLD 10FT (ELECTRODE) IMPLANT
SOL PREP POV-IOD 4OZ 10% (MISCELLANEOUS) IMPLANT
SPONGE INTESTINAL PEANUT (DISPOSABLE) IMPLANT
SPONGE LAP 18X18 X RAY DECT (DISPOSABLE) IMPLANT
STAPLER VISISTAT 35W (STAPLE) ×3 IMPLANT
SUCTION POOLE HANDLE (INSTRUMENTS) ×3
SUCTION POOLE TIP (SUCTIONS) IMPLANT
SUT BONE WAX W31G (SUTURE) IMPLANT
SUT ETHIBOND 5 LR DA (SUTURE) IMPLANT
SUT ETHILON 1 LR 30 (SUTURE) ×6 IMPLANT
SUT ETHILON 2 LR (SUTURE) IMPLANT
SUT PROLENE 4 0 RB 1 (SUTURE) ×6
SUT PROLENE 4-0 RB1 18X2 ARM (SUTURE) ×2 IMPLANT
SUT PROLENE 6 0 BV (SUTURE) IMPLANT
SUT SILK 0 TIES 10X30 (SUTURE) IMPLANT
SUT SILK 1 SH (SUTURE) IMPLANT
SUT SILK 1 TIES 10X30 (SUTURE) IMPLANT
SUT SILK 2 0 (SUTURE)
SUT SILK 2 0 SH (SUTURE) ×9 IMPLANT
SUT SILK 2 0 SH CR/8 (SUTURE) IMPLANT
SUT SILK 2 0 TIES 10X30 (SUTURE) IMPLANT
SUT SILK 2-0 18XBRD TIE 12 (SUTURE) IMPLANT
SUT SILK 3 0 TIES 10X30 (SUTURE) IMPLANT
SWAB COLLECTION DEVICE MRSA (MISCELLANEOUS) IMPLANT
SWAB CULTURE ESWAB REG 1ML (MISCELLANEOUS) IMPLANT
SYRINGE 20CC LL (MISCELLANEOUS) ×3 IMPLANT
SYRINGE TOOMEY DISP (SYRINGE) IMPLANT
TAPE UMBILICAL 1/8 X36 TWILL (MISCELLANEOUS) IMPLANT
TOWEL OR 17X26 10 PK STRL BLUE (TOWEL DISPOSABLE) ×3 IMPLANT
TUBE CONNECTING 12'X1/4 (SUCTIONS) ×1
TUBE CONNECTING 12X1/4 (SUCTIONS) ×2 IMPLANT
WATER STERILE IRR 1000ML POUR (IV SOLUTION) IMPLANT
YANKAUER SUCT BULB TIP NO VENT (SUCTIONS) IMPLANT

## 2018-05-08 ENCOUNTER — Encounter (HOSPITAL_COMMUNITY): Payer: Self-pay

## 2018-05-11 LAB — CULTURE, BLOOD (ROUTINE X 2)

## 2018-05-14 LAB — CULTURE, BLOOD (ROUTINE X 2)

## 2018-05-14 NOTE — Progress Notes (Signed)
eLink Physician-Brief Progress Note Patient Name: Jay Krueger DOB: 08/10/1986 MRN: [id number]   Date of Service  2018/05/03  HPI/Events of Note  Patient with s/s of central DI with urine Na of 151 and large volume UOP.  CDS requesting vasopressin infusion.  eICU Interventions  Discussed with pharmacy.  Will order vasopressin gtt 0.5 to 1.0 milliu/kg/hour in hopes of organ recovery.     Intervention Category Major Interventions: Other:  DETERDING,ELIZABETH 2018/05/03, 4:57 AM

## 2018-05-14 NOTE — Progress Notes (Signed)
PT transported to OR for organ donation by RT on ventilator. PT extubated per orders in OR with RN and donor services present.

## 2018-05-14 NOTE — Progress Notes (Signed)
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  The disorder is manifested as involuntary limb movements and formations which can last anywhere from 30 minutes to 10 hours and during which his mental status does not significantly change.  He was in his usual state of health this morning with no indications of a superimposed acute illness, no chest pains no fevers no chills no sweats no coughs.  He was eating some gummy bears when last seen by his caretaker.  When she returned to the room he was unresponsive and she felt he was pulseless.  EMS was called and he was treated for PEA followed by asystole with 45 minutes of ACLS. ROSC was achieved, and he is now in the emergency room on no pressors intubated and mechanically ventilated.  He is sedated on 10 mcg of propofol at the time of my examination.  7/22 The patient remains on the ventilator. He remains on a versed infusion at 4 mcg/hr and of Fentanyl. He has finished rewarming. He is not moving presently. Does not withdraw to painful stimuli. EEG was completetd this AM. I have asked neurolgy to see the patient for prognostic purposes.  7/23 The patient remains on the ventialtor. He was just seen by neurology. He does nit move. His cough and gag are not existent. He does have DTRs in the arms and legs. His EEG was a very suppressed recording. When I placed thepatient oon CPAP he did trtake an occasional breath. MRI is scheduled for today. Neurology spoke to the family. They want to observe thepatient another day off sedation. We are all of the mind that the prognosis for neurolgical recovery is poor at this time.  7/24 The patient had his Stat CT  scan showing signs of massive cerebral edema and some herniation of the cerebellar tonsils. The patient is now DNR. He will not likely survive more than another 24-48 hours. His left pupil is dow dialted to about as well. The family  Is at the bedside. They understand the grave nature of these findings. No escalationopf care at this point..  7/25 The patientent is slated for DCD at about 1PM today. The [place] organ doination folks ar efinalizing the details. The patient startedto develop DI so he ended up overnigh on a infusion of  Vasopressin. His left pupil is about 5mm and theright ppupil about 8-70mm.    PAST MEDICAL HISTORY :  He  has a past medical history of Mental retardation and Seizure (HCC).  PAST SURGICAL HISTORY: He  has a past surgical history that includes Hernia repair.  No Known Allergies  No current facility-administered medications on file prior to encounter.    Current Outpatient Medications on File Prior to Encounter  Medication Sig  . ALPRAZolam (XANAX) 0.5 MG tablet Take 0.5 mg by mouth 3 (three) times daily.  . cloNIDine (CATAPRES) 0.1 MG tablet Take 1 tablet (0.1 mg total) by mouth at bedtime.  . divalproex (DEPAKOTE ER) 500 MG 24 hr tablet Take 500 mg by mouth 2 (two) times daily.  Marland Kitchen OMEPRAZOLE MAGNESIUM PO Take 40 mg by mouth every morning.   Marland Kitchen OVER THE COUNTER MEDICATION Take by mouth at bedtime.  CBD OIL  . polyethylene glycol (MIRALAX / GLYCOLAX) packet Take 17 g by mouth 2 (two) times daily as needed for mild constipation.   . divalproex (DEPAKOTE ER) 250 MG 24 hr tablet TAKE 1 TABLET (250 MG TOTAL) BY MOUTH 2 (TWO) TIMES DAILY. (Patient not taking: Reported on 05/04/2018)    FAMILY HISTORY:  His family history includes Hypertension in his father and mother.  Family reports that his siblings are normal without any dyskinetic movements.  SOCIAL HISTORY: He  reports that he has never smoked. He has never used smokeless tobacco. He reports that he  does not drink alcohol or use drugs.  REVIEW OF SYSTEMS:   At baseline he is ambulatory and able to walk ad lib.  He is nonverbal.  There have been a very few occasions where his parents feel that he may have had episodes that were different from his usual episodes of involuntary movement which included loss of consciousness and collapsing.  These have been very rare.  He is not known ever to have had difficulties with aspiration during his episodes and is never previously been treated for pneumonia.  He has no known history of chest pain, arrhythmias, or heart murmurs.  There is no history of endocrine disorders specifically no thyroid disease or diabetes.  The remainder of the review of systems is not remarkable   SUBJECTIVE:  As above  VITAL SIGNS: BP 117/69   Pulse 93   Temp 98.4 F (36.9 C)   Resp 20   Ht 5\' 10"  (1.778 m)   Wt 277 lb 5.4 oz (125.8 kg)   SpO2 98%   BMI 39.79 kg/m   HEMODYNAMICS:    VENTILATOR SETTINGS: Vent Mode: PRVC FiO2 (%):  [40 %] 40 % Set Rate:  [20 bmp] 20 bmp Vt Set:  [550 mL] 550 mL PEEP:  [5 cmH20] 5 cmH20 Plateau Pressure:  [13 cmH20-18 cmH20] 18 cmH20  INTAKE / OUTPUT: I/O last 3 completed shifts: In: 5026.8 [I.V.:2300.1; Other:325; NG/GT:1440; IV Piggyback:961.8] Out: 5700 [Urine:5400; Emesis/NG output:300]  PHYSICAL EXAMINATION: General: This is a somewhat obese young male who is orally intubated and mechanically ventilated. Neuro:His pupils are about 2mm and poorly reactive. He does not cough or gag or respond to noxious stimuli Cardiovascular: S1 and S2 are regular without murmur rub or gallop.  He does have symmetric 1/2+ lower extremity edema Lungs: Respirations are unlabored, there is symmetric air movement no wheezes  Abdomen: The abdomen is obese and soft without any organomegaly masses or tenderness, he is anicteric. Musculoskeletal: Limbs are pink and warm   LABS:  BMET Recent Labs  Lab 05/06/18 0319 05/06/18 1417  2018-07-21 0206  NA 142 145 151*  K 3.5 3.7 3.9  CL 112* 116* 120*  CO2 23 21* 24  BUN 14 18 18   CREATININE 0.70 0.62 0.76  GLUCOSE 114* 111* 110*    Electrolytes Recent Labs  Lab 04/21/2018 1717  05/06/18 0319 05/06/18 1417 2018-07-21 0206  CALCIUM 8.3*   < > 8.6* 8.7* 8.4*  MG 2.6*  --   --  2.5*  --   PHOS  --   --   --  2.5  --    < > = values in this interval not displayed.    CBC Recent Labs  Lab 05/06/18 0319 05/06/18 1417 2018-07-21 0206  WBC 17.0* 14.2* 11.3*  HGB 13.0 13.0 11.6*  HCT 40.2 40.0 37.5*  PLT 178 161 174    Coag's Recent Labs  Lab 31-May-2018 1717 May 31, 2018 2105 05/03/18 0028 05/06/18 1417  APTT  --  26  --  26  INR 1.20  --  1.10 1.07    Sepsis Markers Recent Labs  Lab May 31, 2018 1730 05/03/18 1000 05/03/18 1114 05/04/18 0406 05/05/18 0432  LATICACIDVEN 7.82* 5.7*  --   --   --   PROCALCITON  --   --  4.95 4.62 1.82    ABG Recent Labs  Lab 05/05/18 0503 05/06/18 1455 05/06/18 2006  PHART 7.445 7.564* 7.416  PCO2ART 31.6* 23.9* 32.6  PO2ART 192.0* 179.0* 168.0*    Liver Enzymes Recent Labs  Lab 05-31-18 1717 05/05/18 1234 05/06/18 1417  AST 147* 100* 58*  ALT 154* 51* 33  ALKPHOS 106 59 55  BILITOT 0.8 0.6 0.6  ALBUMIN 3.6 3.1* 2.8*    Cardiac Enzymes Recent Labs  Lab 05/03/18 0028 05/03/18 0654 05/03/18 1610  TROPONINI 1.80* 1.73* 1.16*    Glucose Recent Labs  Lab 05/06/18 1220 05/06/18 1655 05/06/18 2018 05/06/18 2315 04/19/2018 0419 05/12/2018 0805  GLUCAP 114* 103* 94 94 95 88    Imaging Dg Chest Port 1 View  Result Date: 05/06/2018 CLINICAL DATA:  Acute respiratory failure with hypercapnia, Hx of seizure, ETT present EXAM: PORTABLE CHEST 1 VIEW COMPARISON:  Chest x-rays dated 05/05/2018 and 05/31/18. FINDINGS: Endotracheal tube is well positioned with tip approximately 3 cm above the carina. Enteric tube passes below the diaphragm. LEFT subclavian central line is stable in position with tip at the  level of the upper SVC. Heart size and mediastinal contours are stable. Stable subtle infrahilar airspace opacities, likely mild edema or atelectasis, significantly improved aeration compared to the earlier chest x-ray of 05-31-2018. No new lung findings. No pleural effusion or pneumothorax seen. IMPRESSION: 1. Mild bilateral infrahilar edema and/or atelectasis. Lungs otherwise clear, significantly improved compared to the earlier chest x-ray of 05-31-18. No evidence of pneumonia. 2. Support apparatus is stable in position. Electronically Signed   By: Bary Richard M.D.   On: 05/06/2018 13:44     STUDIES:  Chest x-ray shows a well-placed endotracheal tube and central line without pneumothorax.  He has a right upper lobe infiltrate.  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  ASSESSMENT / PLAN:  PULMONARY A: Chest x-ray shows an overt infiltrate and a wide AA gradient is present.  I am treating this is aspiration with Zosyn.  I suspect that this was a provocation for his arrest. CXR may show some minor densities in the lower lungs.  I don't see anything in the RUL. 02 requirements have not changed. No real change isn respiratory staus  CARDIOVASCULAR  The patient has a stable bp. He is not requiring pressors. He is in a NSR. No change hemodynamically  RENAL  renal fn. And output are normal   GASTROINTESTINAL A: Prophylaxis is with Pepcid   HEMATOLOGIC A: VT prophylaxis is with subcu heparin.  I noticed that he has a substantial lymphocytosis.  This will need to be investigated if it persists.  INFECTIOUS A: Suspect aspiration.  Blood and sputum have been cultured.  Zosyn initiated.   ENDOCRINE A: He is glucose intolerant, this is not baseline for him.  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 They expressed that they wanted to observe the patient another day. His Versed has been off since early yesterday ( about 3PM) and Fentanyl at 10 PM. The patient does not move, open his eyes or withdraw from painful stimuli. The patient has no brain stem reflexes including oculovestibular, corneal, cough or gag. He continues to breath over the ventialtor. June 01, 2018, 9:06 AM

## 2018-05-14 NOTE — Progress Notes (Signed)
Pt extubated per orders in the OR. Time of death 1415, verified by auscultation and bedside monitor by Lyndal PulleyPrincess Striblin, RN and Conni SlipperBecky Bates, RN.

## 2018-05-14 DEATH — deceased

## 2018-06-14 NOTE — Discharge Summary (Signed)
This patient was admitted after a cardiac arrest at home. He presented on 2018/05/04 He likely had a choking episode followed by a respiratory and than cardiac arrest. The patient was down for a significant period of time before ROSC was established. It is unclear how long he had been pulseless before he was discovered at home. When I saw the patient 2-3 days later he did not have rudimentary brain stem reflexes. He developed myoclonic jerking. On 7/24 he developed a dilated right pupil. MRI was ordered on 7/23 but had been delayed. CT scan as of 7/24 showed massive ceerbral edema and signs of tonsillar herniation. The prognosis appeared grave and was confirmed by neurolgy. The family was a[pproached by [place] [facility] and they elected to make a DCD donation the following day on 7/25. The patient was sent to the OR about 1pm on that day and was pronounced dead in the OR

## 2019-07-24 IMAGING — CT CT HEAD W/O CM
4 series · 15 of 47 positions shown, 17 images · non-contrast
Comparison: CT head 05/02/2018

CLINICAL DATA: New anisocoria with blown right pupil. Altered level
of consciousness.

EXAM:
CT HEAD WITHOUT CONTRAST
TECHNIQUE: Contiguous axial images were obtained from the base of the skull
through the vertex without intravenous contrast.

[Series 3: head without · axial · non-contrast · 0.52mm/px · z∈[+949,+1089]mm · 7 of 38 slices shown, 9 images]
[im 5/38  brain]
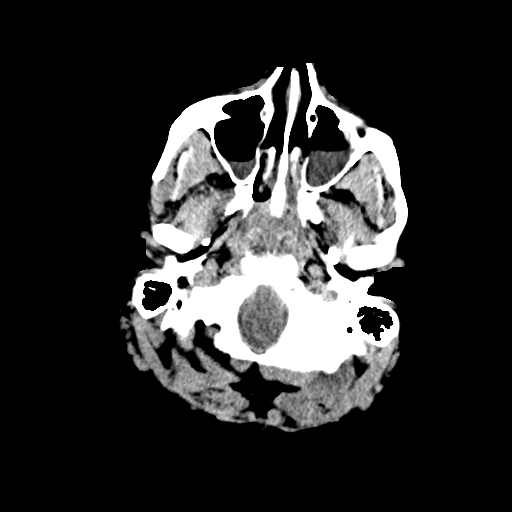
[im 5/38  bone]
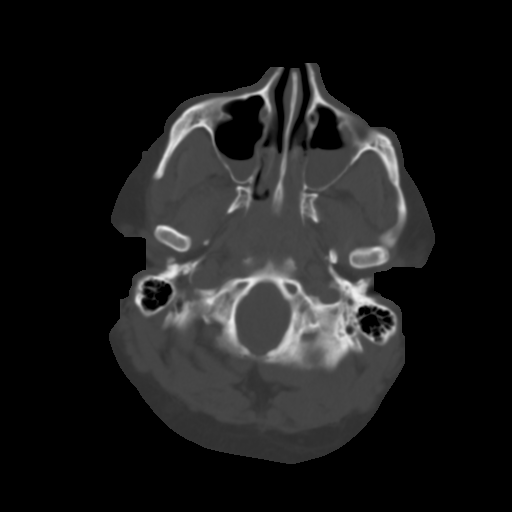
[im 10/38  brain]
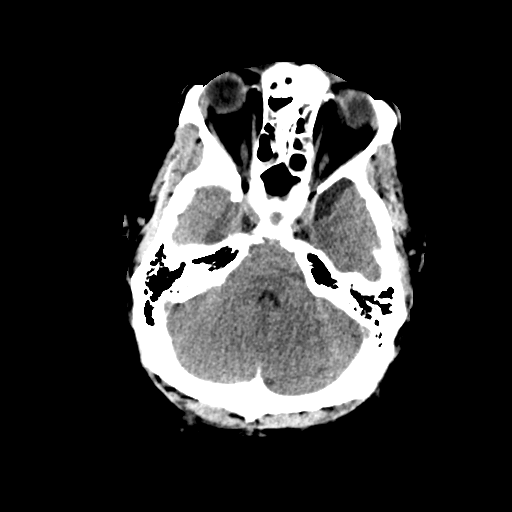
[im 14/38  brain]
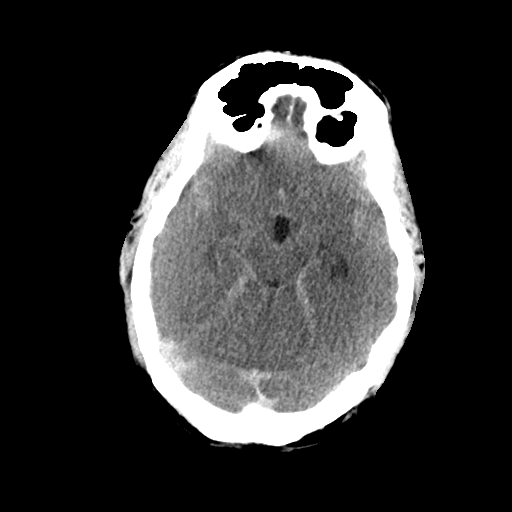
[im 19/38  brain]
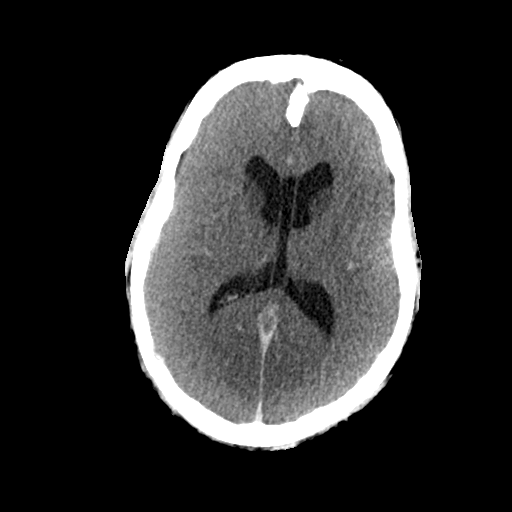
[im 24/38  brain]
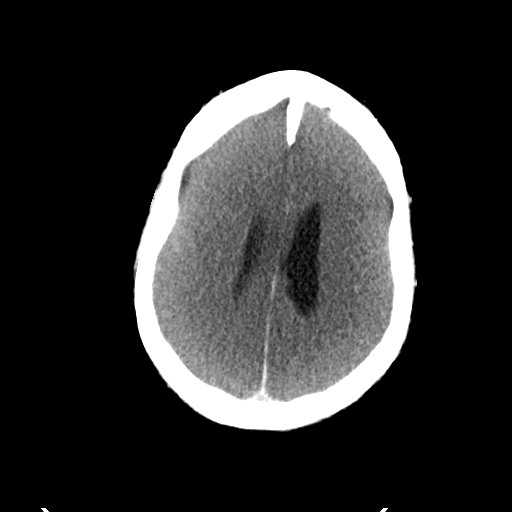
[im 24/38  bone]
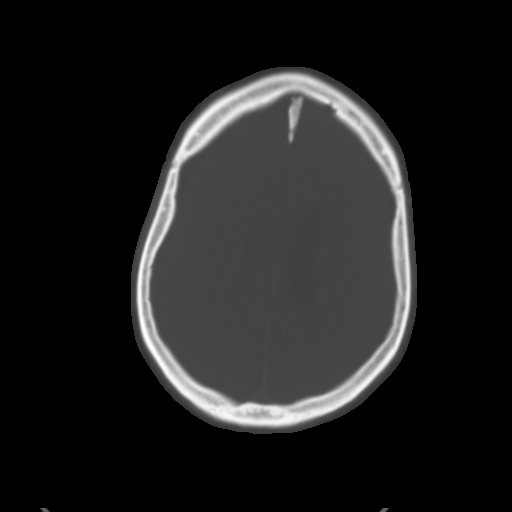
[im 28/38  brain]
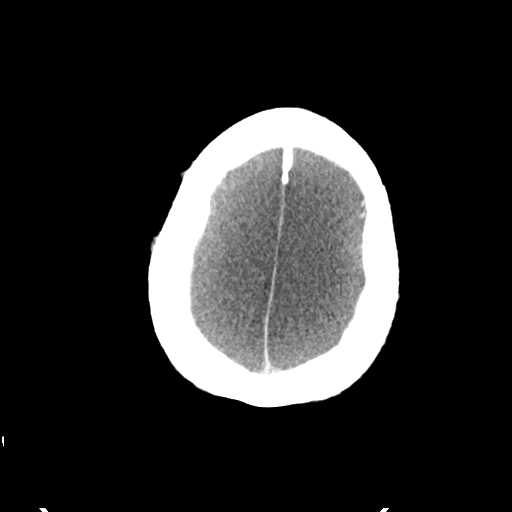
[im 33/38  brain]
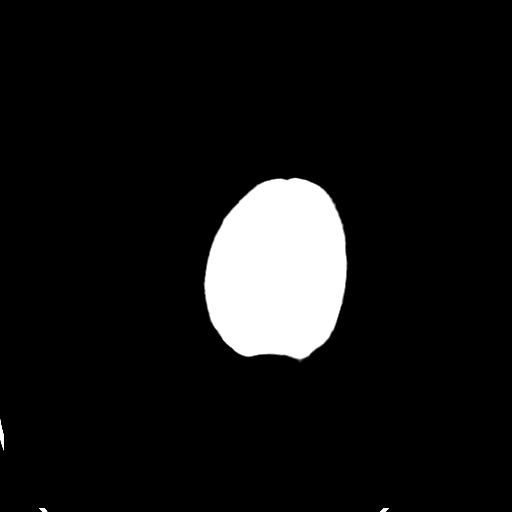

[Series 4: head bone · axial · 0.52mm/px · z∈[+947,+965]mm · 2 of 94 slices shown]
[im 10/94  bone]
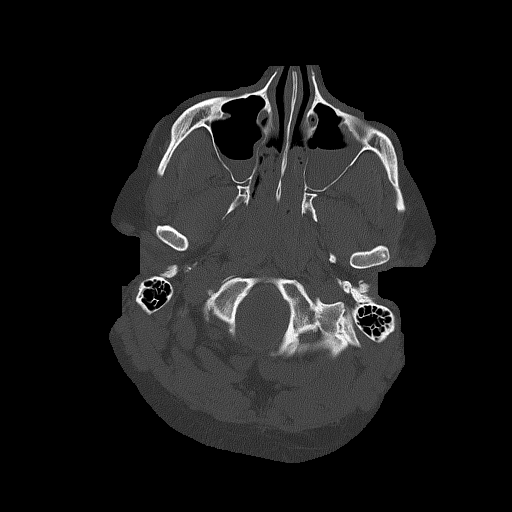
[im 19/94  bone]
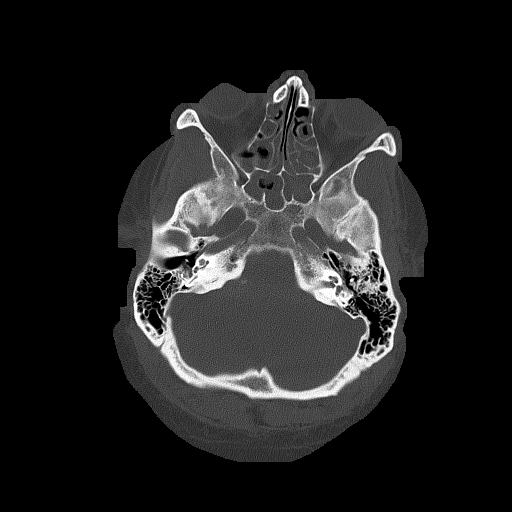

[Series 5: head without cor · coronal · non-contrast · 0.37mm/px · 3 of 81 slices shown]
[im 27/81  brain]
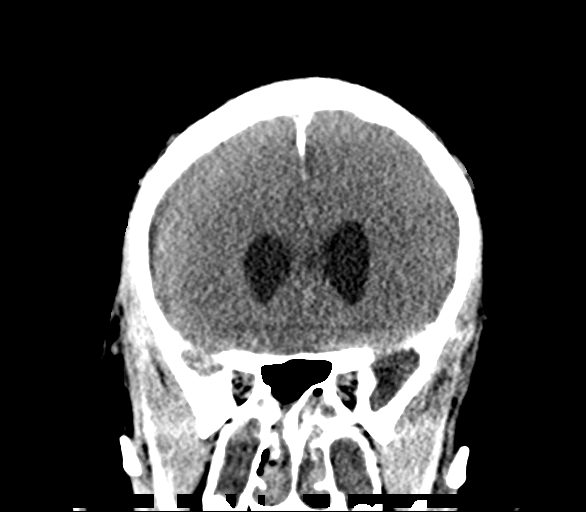
[im 36/81  brain]
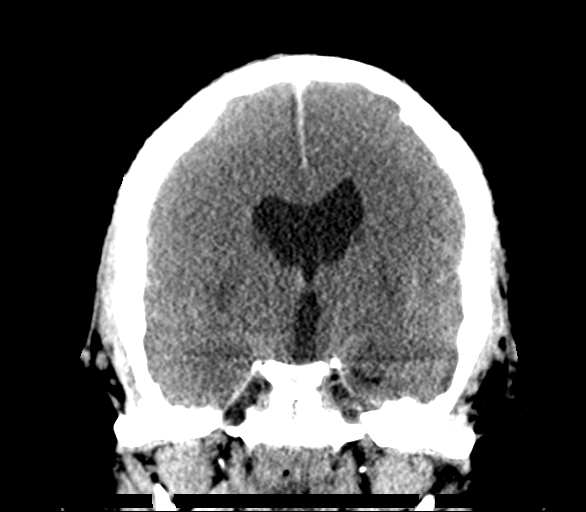
[im 45/81  brain]
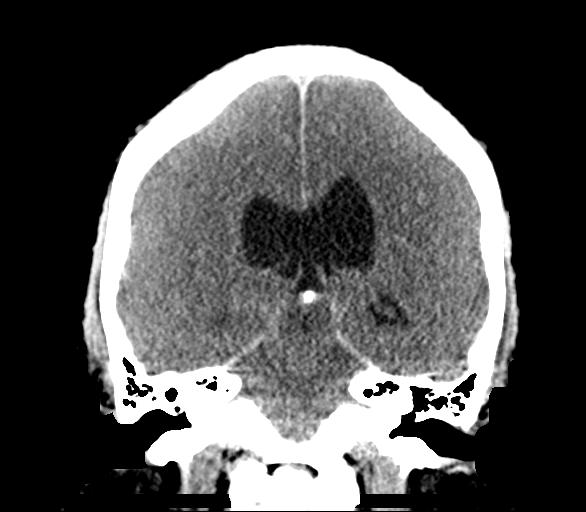

[Series 6: head without sag · sagittal · non-contrast · 0.37mm/px · 3 of 68 slices shown]
[im 23/68  brain]
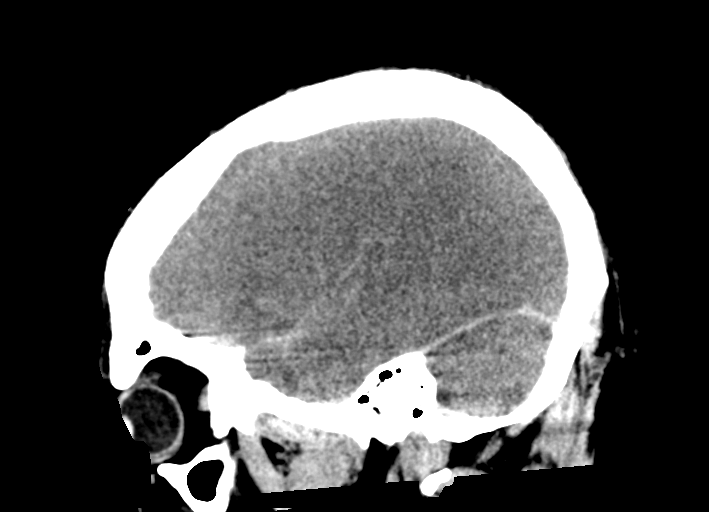
[im 34/68  brain]
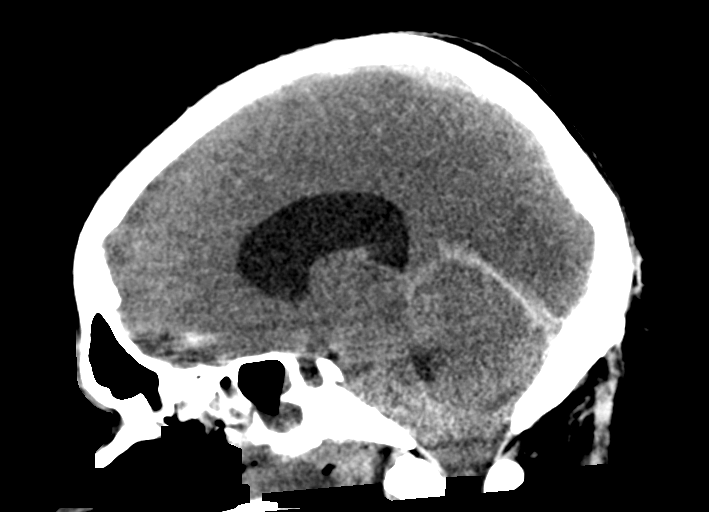
[im 45/68  brain]
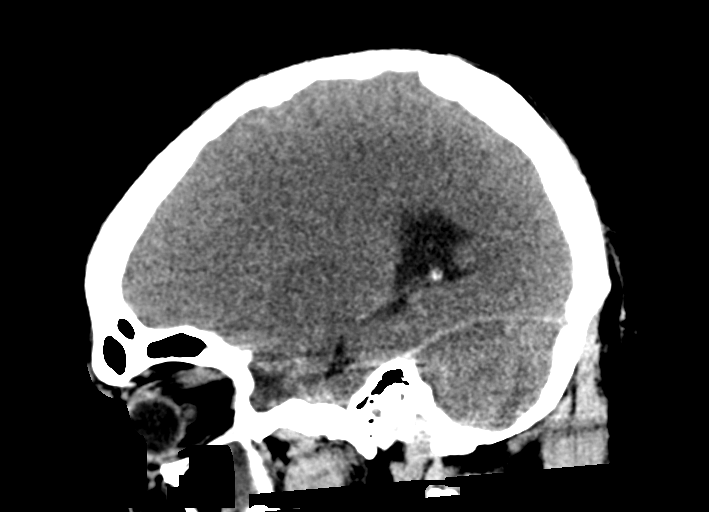

[15 of 47 positions shown; findings below may reference images not displayed]

FINDINGS: Brain: Interval development of severe diffuse cerebral edema. Loss
of gray-white differentiation with effacement of the sulci
bilaterally. Effacement of the basilar cisterns. Mild ventricular
enlargement. Mild downward herniation of the cerebellar tonsils
bilaterally. Effacement of the arachnoid cyst on the left due to
increased intracranial pressure.

Hypodensity in the putamen and caudate bilaterally has developed
since the prior study due to acute infarction. Negative for
hemorrhage. No midline shift.

Vascular: Hyperdensity in the mid sylvian fissure bilaterally is
felt to be due to middle cerebral arteries. Diffuse vascular density
is pronounced due to cerebral edema.

Skull: Negative

Sinuses/Orbits: Mucosal edema and air-fluid levels throughout the
paranasal sinuses. Normal orbit.

Other: None
IMPRESSION: Dramatic interval change since 3 days ago. There is now diffuse
cerebral edema and increased intracranial pressure. Hypodensity in
the basal ganglia bilaterally compatible with anoxic brain injury
and infarction.

Sinusitis with air-fluid levels.

These results were called by telephone at the time of interpretation
on 05/05/2018 at [DATE] to Dr. Willker Alixandre, who verbally
acknowledged these results.

## 2019-07-25 IMAGING — DX DG CHEST 1V PORT
1 series · 1 of 1 positions shown · non-contrast
Comparison: Chest x-rays dated 05/05/2018 and 05/02/2018.

CLINICAL DATA: Acute respiratory failure with hypercapnia, Hx of
seizure, ETT present

EXAM:
PORTABLE CHEST 1 VIEW

[chest]
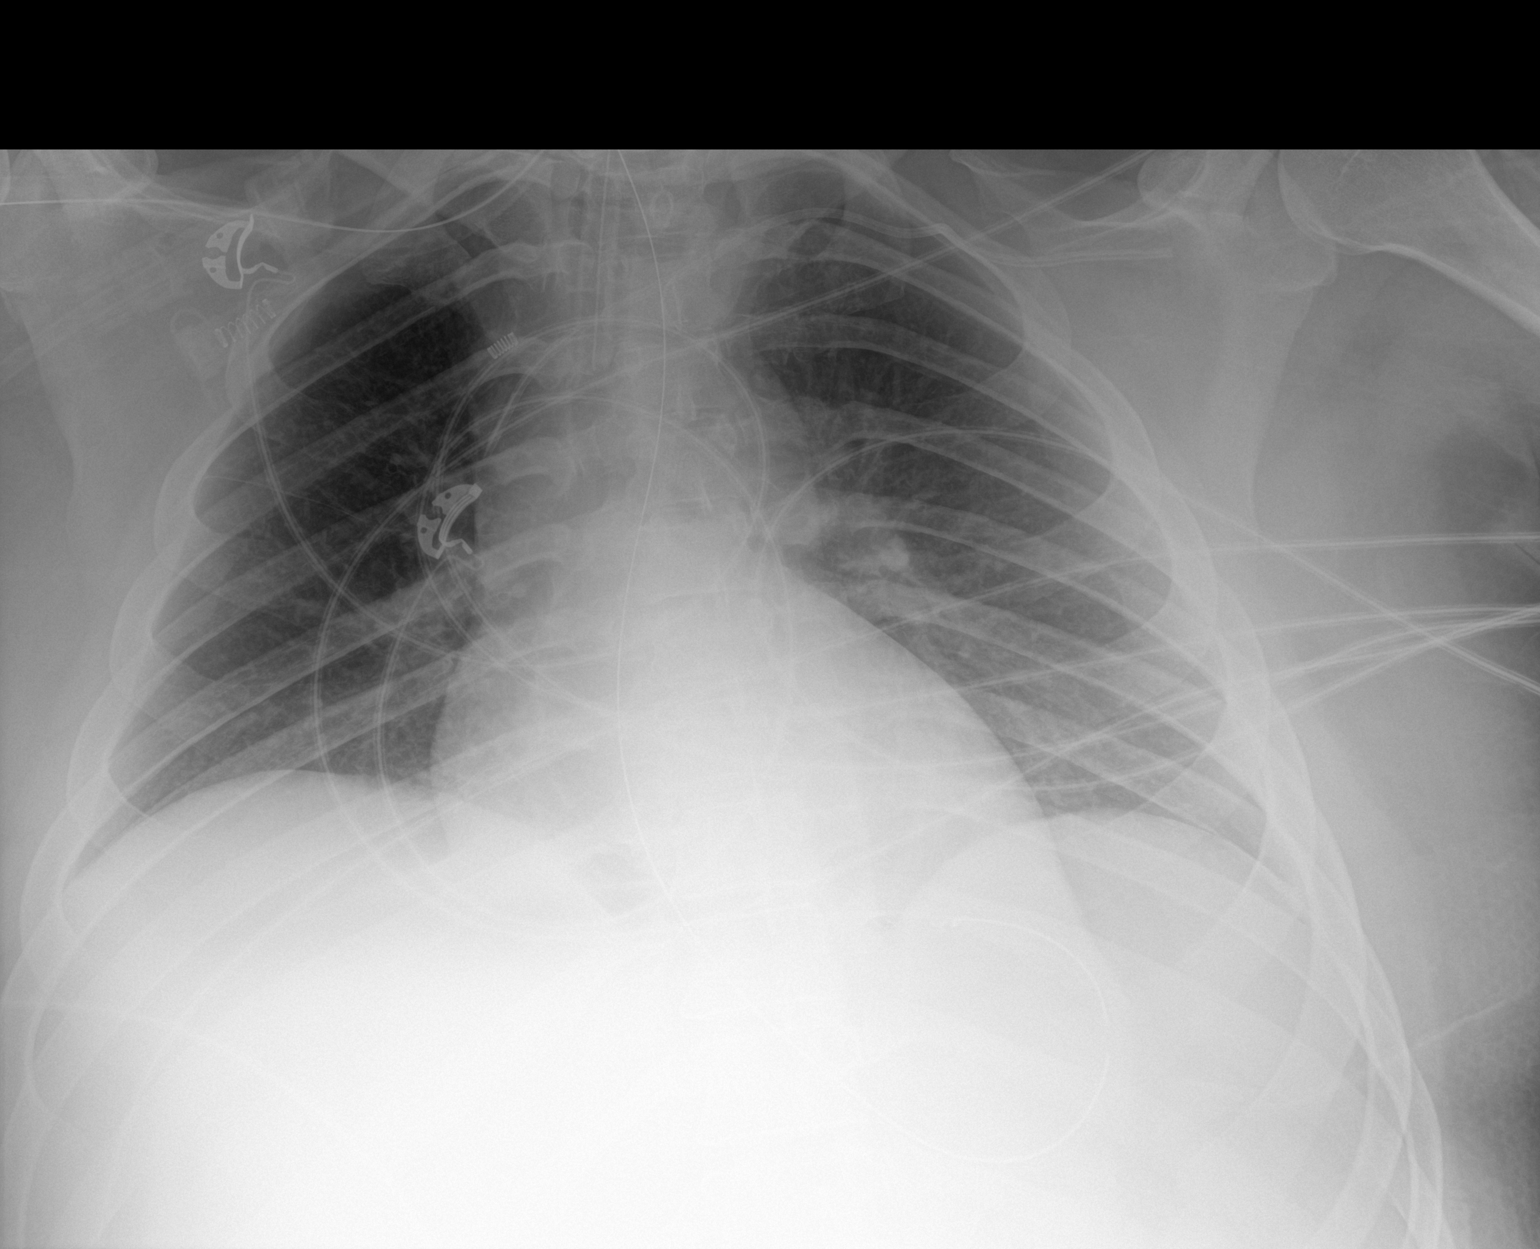

[1 of 1 positions shown; findings below may reference images not displayed]

FINDINGS: Endotracheal tube is well positioned with tip approximately 3 cm
above the carina. Enteric tube passes below the diaphragm. LEFT
subclavian central line is stable in position with tip at the level
of the upper SVC. Heart size and mediastinal contours are stable.

Stable subtle infrahilar airspace opacities, likely mild edema or
atelectasis, significantly improved aeration compared to the earlier
chest x-ray of 05/02/2018. No new lung findings. No pleural effusion
or pneumothorax seen.
IMPRESSION: 1. Mild bilateral infrahilar edema and/or atelectasis. Lungs
otherwise clear, significantly improved compared to the earlier
chest x-ray of 05/02/2018. No evidence of pneumonia.
2. Support apparatus is stable in position.
